# Patient Record
Sex: Male | Born: 1985 | Race: Black or African American | Hispanic: No | Marital: Single | Smoking: Current every day smoker
Health system: Southern US, Community
[De-identification: ages and names within clinical notes are randomized; demographics above are authoritative.]

## PROBLEM LIST (undated history)

## (undated) ENCOUNTER — Emergency Department (HOSPITAL_COMMUNITY): Admission: EM | Payer: 59

## (undated) DIAGNOSIS — S52209A Unspecified fracture of shaft of unspecified ulna, initial encounter for closed fracture: Secondary | ICD-10-CM

## (undated) DIAGNOSIS — K3532 Acute appendicitis with perforation and localized peritonitis, without abscess: Secondary | ICD-10-CM

## (undated) DIAGNOSIS — R339 Retention of urine, unspecified: Secondary | ICD-10-CM

## (undated) DIAGNOSIS — R011 Cardiac murmur, unspecified: Secondary | ICD-10-CM

## (undated) HISTORY — DX: Unspecified fracture of shaft of unspecified ulna, initial encounter for closed fracture: S52.209A

## (undated) HISTORY — PX: APPENDECTOMY: SHX54

## (undated) HISTORY — DX: Retention of urine, unspecified: R33.9

## (undated) HISTORY — DX: Acute appendicitis with perforation and localized peritonitis, without abscess: K35.32

---
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
## 2007-03-07 ENCOUNTER — Emergency Department (HOSPITAL_COMMUNITY): Admission: EM | Admit: 2007-03-07 | Discharge: 2007-03-07 | Payer: Self-pay | Admitting: Emergency Medicine

## 2007-07-17 ENCOUNTER — Ambulatory Visit (HOSPITAL_BASED_OUTPATIENT_CLINIC_OR_DEPARTMENT_OTHER): Admission: RE | Admit: 2007-07-17 | Discharge: 2007-07-17 | Payer: Self-pay | Admitting: Urology

## 2008-09-23 ENCOUNTER — Emergency Department (HOSPITAL_COMMUNITY): Admission: EM | Admit: 2008-09-23 | Discharge: 2008-09-23 | Payer: Self-pay | Admitting: Emergency Medicine

## 2010-08-27 ENCOUNTER — Emergency Department (HOSPITAL_COMMUNITY)
Admission: EM | Admit: 2010-08-27 | Discharge: 2010-08-27 | Payer: Self-pay | Source: Home / Self Care | Admitting: Family Medicine

## 2010-11-17 LAB — RAPID STREP SCREEN (MED CTR MEBANE ONLY): Streptococcus, Group A Screen (Direct): NEGATIVE

## 2010-12-15 NOTE — Op Note (Signed)
NAME:  [name], [name]                 ACCOUNT NO.:  [id number]   MEDICAL RECORD NO.:  [id number]          PATIENT TYPE:  AMB   LOCATION:  [facility]                         FACILITY:  [facility]   PHYSICIAN:  Valetta Fuller, M.D.  DATE OF BIRTH:  [birth year]   DATE OF PROCEDURE:  07/17/2007  DATE OF DISCHARGE:  07/17/2007                               OPERATIVE REPORT   PREOPERATIVE DIAGNOSIS:  Penile condyloma.   POSTOPERATIVE DIAGNOSIS:  Penile condyloma.   PROCEDURE PERFORMED:  SLT Yag laser of penile condyloma.   SURGEON:  Valetta Fuller, M.D.   ANESTHESIA:  General.   INDICATIONS:  Mr. Aldyn is a 25 year old male who had presented with  penile condyloma.  He was initially treated with topical therapy which  failed.  His disease was not particularly extensive, but did involve a  fairly broad area within the mucosal collar on the ventral aspect of his  penis.  For that reason, we did not think he would tolerate a cryo  procedure in the office and it would be difficult due to the spread-out  nature of the lesions.  We talked about several options and he did elect  to try SLT Yag laser.  He appeared to understand the complications as  well as the advantages and disadvantages of this approach.   TECHNIQUE AND FINDINGS:  The patient was brought to the operating room,  where he underwent successful induction of general anesthesia.  He  abdomen was soft placed in a supine position.  Moist/wet towels were  placed around the penis to reduce the risk of fire.  Aceto acid was used  for whitening.  Numerous lesions were noted on the ventral surface of  the penis within the mucosal collar.  The meatus was uninvolved.  All  the lesions were treated with a round-tip Contact SLT Yag laser  utilizing 15 watts of power.  All visible lesions were treated.  Bacitracin ointment was placed on the areas of treatment.  The patient  appeared to tolerate the procedure well.  There were no obvious  complications  or problems.           ______________________________  Valetta Fuller, M.D.  Electronically Signed     DSG/MEDQ  D:  07/18/2007  T:  07/19/2007  Job:  [id number]

## 2011-02-03 ENCOUNTER — Emergency Department (HOSPITAL_COMMUNITY)
Admission: EM | Admit: 2011-02-03 | Discharge: 2011-02-03 | Disposition: A | Discharge status: Home / Self Care | Payer: Self-pay | Attending: Emergency Medicine | Admitting: Emergency Medicine

## 2011-02-03 ENCOUNTER — Emergency Department (HOSPITAL_COMMUNITY): Payer: Self-pay

## 2011-02-03 DIAGNOSIS — R0789 Other chest pain: Secondary | ICD-10-CM | POA: Insufficient documentation

## 2011-02-03 DIAGNOSIS — R0989 Other specified symptoms and signs involving the circulatory and respiratory systems: Secondary | ICD-10-CM | POA: Insufficient documentation

## 2011-02-03 DIAGNOSIS — R0602 Shortness of breath: Secondary | ICD-10-CM | POA: Insufficient documentation

## 2011-02-03 DIAGNOSIS — R0609 Other forms of dyspnea: Secondary | ICD-10-CM | POA: Insufficient documentation

## 2011-04-05 ENCOUNTER — Emergency Department (HOSPITAL_COMMUNITY)
Admission: EM | Admit: 2011-04-05 | Discharge: 2011-04-05 | Disposition: A | Discharge status: Home / Self Care | Payer: Self-pay | Attending: Emergency Medicine | Admitting: Emergency Medicine

## 2011-04-05 DIAGNOSIS — S0990XA Unspecified injury of head, initial encounter: Secondary | ICD-10-CM | POA: Insufficient documentation

## 2011-04-05 DIAGNOSIS — S0180XA Unspecified open wound of other part of head, initial encounter: Secondary | ICD-10-CM | POA: Insufficient documentation

## 2011-04-05 DIAGNOSIS — IMO0002 Reserved for concepts with insufficient information to code with codable children: Secondary | ICD-10-CM | POA: Insufficient documentation

## 2011-04-13 ENCOUNTER — Emergency Department (HOSPITAL_COMMUNITY)
Admission: EM | Admit: 2011-04-13 | Discharge: 2011-04-13 | Disposition: A | Discharge status: Home / Self Care | Payer: No Typology Code available for payment source | Attending: Emergency Medicine | Admitting: Emergency Medicine

## 2011-04-13 DIAGNOSIS — Z4802 Encounter for removal of sutures: Secondary | ICD-10-CM | POA: Insufficient documentation

## 2011-05-07 LAB — POCT HEMOGLOBIN-HEMACUE
Hemoglobin: 14.9
Operator id: 134391

## 2011-05-18 ENCOUNTER — Emergency Department (HOSPITAL_COMMUNITY)
Admission: EM | Admit: 2011-05-18 | Discharge: 2011-05-18 | Disposition: A | Discharge status: Home / Self Care | Payer: Self-pay | Attending: Emergency Medicine | Admitting: Emergency Medicine

## 2011-05-18 ENCOUNTER — Emergency Department (HOSPITAL_COMMUNITY): Payer: Self-pay

## 2011-05-18 DIAGNOSIS — R10819 Abdominal tenderness, unspecified site: Secondary | ICD-10-CM | POA: Insufficient documentation

## 2011-05-18 DIAGNOSIS — R109 Unspecified abdominal pain: Secondary | ICD-10-CM | POA: Insufficient documentation

## 2011-05-18 LAB — COMPREHENSIVE METABOLIC PANEL
ALT: 123 U/L — ABNORMAL HIGH (ref 0–53)
Albumin: 3.9 g/dL (ref 3.5–5.2)
Alkaline Phosphatase: 170 U/L — ABNORMAL HIGH (ref 39–117)
Potassium: 3.5 mEq/L (ref 3.5–5.1)
Sodium: 140 mEq/L (ref 135–145)
Total Protein: 7.8 g/dL (ref 6.0–8.3)

## 2011-05-18 LAB — CBC
Hemoglobin: 14.3 g/dL (ref 13.0–17.0)
MCHC: 34.3 g/dL (ref 30.0–36.0)
RDW: 13.3 % (ref 11.5–15.5)
WBC: 9 10*3/uL (ref 4.0–10.5)

## 2011-05-18 LAB — URINALYSIS, ROUTINE W REFLEX MICROSCOPIC
Bilirubin Urine: NEGATIVE
Leukocytes, UA: NEGATIVE
Nitrite: NEGATIVE
Specific Gravity, Urine: 1.016 (ref 1.005–1.030)
Urobilinogen, UA: 1 mg/dL (ref 0.0–1.0)

## 2011-05-18 LAB — DIFFERENTIAL
Basophils Absolute: 0 10*3/uL (ref 0.0–0.1)
Basophils Relative: 0 % (ref 0–1)
Monocytes Absolute: 0.7 10*3/uL (ref 0.1–1.0)
Neutro Abs: 5.4 10*3/uL (ref 1.7–7.7)

## 2011-05-22 ENCOUNTER — Inpatient Hospital Stay (HOSPITAL_COMMUNITY)
Admission: EM | Admit: 2011-05-22 | Discharge: 2011-06-07 | DRG: 339 | Disposition: A | Discharge status: Home / Self Care | Payer: Self-pay | Attending: Surgery | Admitting: Surgery

## 2011-05-22 ENCOUNTER — Emergency Department (HOSPITAL_COMMUNITY): Payer: Self-pay

## 2011-05-22 DIAGNOSIS — K929 Disease of digestive system, unspecified: Secondary | ICD-10-CM | POA: Diagnosis not present

## 2011-05-22 DIAGNOSIS — Y836 Removal of other organ (partial) (total) as the cause of abnormal reaction of the patient, or of later complication, without mention of misadventure at the time of the procedure: Secondary | ICD-10-CM | POA: Diagnosis not present

## 2011-05-22 DIAGNOSIS — R1031 Right lower quadrant pain: Secondary | ICD-10-CM

## 2011-05-22 DIAGNOSIS — K560 Paralytic ileus: Secondary | ICD-10-CM | POA: Diagnosis not present

## 2011-05-22 DIAGNOSIS — K3532 Acute appendicitis with perforation and localized peritonitis, without abscess: Secondary | ICD-10-CM | POA: Diagnosis present

## 2011-05-22 DIAGNOSIS — R339 Retention of urine, unspecified: Secondary | ICD-10-CM | POA: Diagnosis not present

## 2011-05-22 DIAGNOSIS — K3533 Acute appendicitis with perforation and localized peritonitis, with abscess: Principal | ICD-10-CM | POA: Diagnosis present

## 2011-05-22 LAB — DIFFERENTIAL
Eosinophils Relative: 1 % (ref 0–5)
Lymphocytes Relative: 16 % (ref 12–46)
Lymphs Abs: 2 10*3/uL (ref 0.7–4.0)
Monocytes Absolute: 1.1 10*3/uL — ABNORMAL HIGH (ref 0.1–1.0)
Monocytes Relative: 9 % (ref 3–12)

## 2011-05-22 LAB — CBC
HCT: 42.2 % (ref 39.0–52.0)
MCHC: 34.4 g/dL (ref 30.0–36.0)
MCV: 87.7 fL (ref 78.0–100.0)
RDW: 12.9 % (ref 11.5–15.5)

## 2011-05-22 LAB — COMPREHENSIVE METABOLIC PANEL
Albumin: 3.8 g/dL (ref 3.5–5.2)
BUN: 9 mg/dL (ref 6–23)
Chloride: 96 mEq/L (ref 96–112)
Creatinine, Ser: 0.83 mg/dL (ref 0.50–1.35)
Total Bilirubin: 0.5 mg/dL (ref 0.3–1.2)
Total Protein: 8.1 g/dL (ref 6.0–8.3)

## 2011-05-22 LAB — LIPASE, BLOOD: Lipase: 12 U/L (ref 11–59)

## 2011-05-22 MED ORDER — IOHEXOL 300 MG/ML  SOLN: 100.0000 mL | Freq: Once | INTRAMUSCULAR | Status: AC | PRN

## 2011-05-23 ENCOUNTER — Inpatient Hospital Stay (HOSPITAL_COMMUNITY): Payer: Self-pay

## 2011-05-23 LAB — APTT: aPTT: 36 seconds (ref 24–37)

## 2011-05-23 LAB — CBC
HCT: 38.1 % — ABNORMAL LOW (ref 39.0–52.0)
RDW: 12.9 % (ref 11.5–15.5)
WBC: 11 10*3/uL — ABNORMAL HIGH (ref 4.0–10.5)

## 2011-05-23 LAB — BASIC METABOLIC PANEL
Calcium: 9.2 mg/dL (ref 8.4–10.5)
Chloride: 102 mEq/L (ref 96–112)
Creatinine, Ser: 0.81 mg/dL (ref 0.50–1.35)
GFR calc Af Amer: >90 mL/min (ref >90)
GFR calc non Af Amer: >90 mL/min (ref >90)

## 2011-05-23 LAB — PROTIME-INR
INR: 1.32 (ref 0.00–1.49)
Prothrombin Time: 16.6 seconds — ABNORMAL HIGH (ref 11.6–15.2)

## 2011-05-24 ENCOUNTER — Other Ambulatory Visit (INDEPENDENT_AMBULATORY_CARE_PROVIDER_SITE_OTHER): Payer: Self-pay | Admitting: General Surgery

## 2011-05-24 DIAGNOSIS — K352 Acute appendicitis with generalized peritonitis, without abscess: Secondary | ICD-10-CM

## 2011-05-24 LAB — CBC
HCT: 33.9 % — ABNORMAL LOW (ref 39.0–52.0)
Platelets: 298 10*3/uL (ref 150–400)
RDW: 13.1 % (ref 11.5–15.5)
WBC: 11.8 10*3/uL — ABNORMAL HIGH (ref 4.0–10.5)

## 2011-05-25 DIAGNOSIS — R339 Retention of urine, unspecified: Secondary | ICD-10-CM

## 2011-05-25 LAB — BASIC METABOLIC PANEL
CO2: 30 mEq/L (ref 19–32)
Glucose, Bld: 122 mg/dL — ABNORMAL HIGH (ref 70–99)
Potassium: 3.9 mEq/L (ref 3.5–5.1)
Sodium: 141 mEq/L (ref 135–145)

## 2011-05-25 LAB — CBC
Hemoglobin: 11.8 g/dL — ABNORMAL LOW (ref 13.0–17.0)
RBC: 4.02 MIL/uL — ABNORMAL LOW (ref 4.22–5.81)
WBC: 14.4 10*3/uL — ABNORMAL HIGH (ref 4.0–10.5)

## 2011-05-26 LAB — CBC
HCT: 35.2 % — ABNORMAL LOW (ref 39.0–52.0)
Hemoglobin: 11.4 g/dL — ABNORMAL LOW (ref 13.0–17.0)
MCH: 29.5 pg (ref 26.0–34.0)
MCHC: 32.4 g/dL (ref 30.0–36.0)
RBC: 3.87 MIL/uL — ABNORMAL LOW (ref 4.22–5.81)

## 2011-05-27 LAB — BODY FLUID CULTURE

## 2011-05-27 NOTE — Consult Note (Signed)
NAME:  [name], [name]                 ACCOUNT NO.:  [id number]  MEDICAL RECORD NO.:  [id number]  LOCATION:  [facility]                         FACILITY:  [facility]  PHYSICIAN:  Abigail Miyamoto, M.D. DATE OF BIRTH:  07-19-1986  DATE OF CONSULTATION:  05/22/2011 DATE OF DISCHARGE:                                CONSULTATION   CHIEF COMPLAINT:  Right-sided abdominal pain.  HISTORY:  This is a 25 year old male with a 2-week history of right lower quadrant abdominal pain.  He reports it came on suddenly approximately 2 weeks ago.  He had no nausea or vomiting with this.  He had difficulty walking and going up stairs secondary to the pain and some difficulty passing his urine secondary to discomfort.  He reports he has improved slightly and presented to the emergency room today, otherwise GI complaints, he has had no previous similar history of abdominal pain.  PAST MEDICAL HISTORY:  Negative.  PAST SURGICAL HISTORY:  Negative.  MEDICATIONS:  None.  ALLERGIES:  No known drug allergies.  FAMILY HISTORY:  Negative for diabetes, hypertension, or inflammatory bowel disease.  SOCIAL HISTORY:  He does admit to smoking.  Denies alcohol use.  REVIEW OF SYSTEMS:  GENERAL:  Negative for fever, chills.  PULMONARY: Negative for cough, shortness of breath, difficulty breathing.  CARDIAC: Negative for chest pain, irregular heartbeat.  ABDOMEN:  Listed as above.  Bowel movements have been normal.  URINARY:  Negative for dysuria or hematuria.  Rest of review of systems including skin, eyes, ears, nose and throat, musculoskeletal, neurologic, psychiatric, endocrine are normal.  PHYSICAL EXAMINATION:  GENERAL:  This is a very thin gentleman in no acute distress.  He is very comfortable in appearance. VITAL SIGNS:  Temperature of 100.6, pulse 83, respiratory rate is 18, blood pressure is 107/67. HEENT:  Eyes anicteric.  Pupils are reactive bilaterally.  ENT, external ears and nose are normal.  Hearing  is normal.  Oropharynx is clear. NECK:  Supple.  Trachea is midline.  There is no thyromegaly. LUNGS:  Clear to auscultation bilaterally with normal respiratory effort. CARDIOVASCULAR:  Regular rate and rhythm.  There is a systolic ejection murmur.  There is no peripheral edema. ABDOMEN:  Soft.  There is mild fullness in the right lower quadrant. There is only mild tenderness and almost no guarding.  The rest of the abdomen is soft and nontender. EXTREMITIES:  Warm, well perfused.  No edema, clubbing, or cyanosis. Peripheral pulses are intact to all 4 extremities. NEUROLOGIC:  Awake, alert, and oriented.  Motor and sensory function are gross intact to all 4 extremities. PSYCHIATRIC:  Normal behavior and affect.  DATA REVIEWED:  The patient has a white blood count elevated at 12.7 with a left shift.  Hemoglobin is 14.5, creatinine is 0.83, glucose is 94.  The patient has a CAT scan of the abdomen and pelvis that shows an abscess in the right lower quadrant which is approximately 5 cm in size. There is adenopathy around this.  It is difficult to the terminal ileum. The appendix is not visualized.  IMPRESSION:  The patient with intraabdominal abscess of uncertain etiology.  I do suspect ruptured appendicitis.  At this point,  I will discuss with the patient and his mother.  I recommended admission to the hospital for IV antibiotics and then Interventional Radiology will be consulted, place a CT-guided drainage catheter.  After I had long discussion with him and his family, he has refused admission to the hospital.  I explained the risk of signing out against medical advice including ongoing infection and even death.  Despite this, he does not wish to stay in the hospital.  He does state he will return tomorrow to the hospital which I have encouraged him to do.     Abigail Miyamoto, M.D.     DB/MEDQ  D:  05/22/2011  T:  05/22/2011  Job:  [id number]  Electronically Signed by Abigail Miyamoto M.D. on 05/27/2011 12:36:42 PM

## 2011-05-27 NOTE — Op Note (Signed)
[name], [name]                 ACCOUNT NO.:  [id number]  MEDICAL RECORD NO.:  [id number]  LOCATION:  5114                         FACILITY:  [facility]  PHYSICIAN:  Cherylynn Ridges, M.D.    DATE OF BIRTH:  1986-02-27  DATE OF PROCEDURE:  05/24/2011 DATE OF DISCHARGE:                              OPERATIVE REPORT   PREOPERATIVE DIAGNOSIS:  Ruptured appendicitis with abscess.  POSTOPERATIVE DIAGNOSIS:  Ruptured retrocecal appendicitis with abscess.  PROCEDURE:  Appendectomy with drainage of retrocecal abscess.  SURGEON:  Cherylynn Ridges, MD  ASSISTANT:  Brayton El, physician assistant.  ANESTHESIA:  General endotracheal.  ESTIMATED BLOOD LOSS:  200 mL.  COMPLICATIONS:  None.  CONDITION:  Stable.  FINDINGS:  Abscess cavity in the right lower quadrant in a retrocecal position with a pulverized appendix, however, the base appeared to be fine and we could ligate it without concern.  INDICATION FOR OPERATION:  The patient is a 25 year old male who was admitted 2 days ago with abdominal pain and CT showing an abscess. Percutaneous drainage was not possible.  He was started on IV antibiotics.  We reassessed the possibility of a percutaneous drain, however, because it did not seem as though this was forthcoming or possible and the abscess cavity and perforated appendix was not going to improve with IV antibiotics alone, he was taken to the operation.  DESCRIPTION OF OPERATION:  The patient was taken to the operating room and placed on table in a supine position.  After an adequate general endotracheal anesthetic was administered, he was prepped and draped in usual sterile manner exposing mostly the lower abdomen.  After proper time-out was performed identifying the patient and procedure to be performed, we made a lower midline incision using a #10 blade just to the right of the umbilicus.  We took it down to and into the peritoneal cavity with minimal difficulty.  We were able  to mobilize and identify a large inflammatory complex in the right lower quadrant. As we mobilized again, we entered into a purulent cavity and abscess cavity.  Aerobic and anaerobic cultures were sent.  Once that was done, we mobilized the right colon just up to the midportion of the ascending colon and the terminal ileum.  Upon reviewing the process, it seems as though the patient had a retrocecal appendix that ruptured into the retroperitoneum and also into the terminal ileum.  We were able to identify the base of the appendix at the cecum and we came across that with a TA 30 blue 3.5 mm stapler.  We came across the mesentery with minimal difficulty, did ligate 1 area with a 2-0 silk tie.  We had to dissect out what appeared to be the tip of the appendix underneath the terminal ileum in this very dense inflammatory rind.  As we did so, we were able to come across what appeared to be the tip of the appendix. We sent all that as one single specimen, although a lot of it appeared to be just pulverized and torn up tissue.  Once we had controlled the base and we had come across that, we irrigated with about 2 L of saline solution.  A 19-mm Blake drain was placed into the abscess cavity and just to the right of the distal colon.  We sutured it in place with 3-0 nylon.  We closed using looped 0- PDS suture.  We left the skin open, however, we put in some interrupted horizontal mattress sutures to be tied later once the wound was cleaned up and the patient's temperature has resolved.  All needle counts, sponge counts, and instrument counts were correct.     Cherylynn Ridges, M.D.     JOW/MEDQ  D:  05/24/2011  T:  05/24/2011  Job:  [id number]  Electronically Signed by Jimmye Norman M.D. on 05/27/2011 04:52:42 PM

## 2011-05-28 ENCOUNTER — Inpatient Hospital Stay (HOSPITAL_COMMUNITY): Payer: Self-pay

## 2011-05-28 LAB — ANAEROBIC CULTURE

## 2011-05-29 LAB — COMPREHENSIVE METABOLIC PANEL
ALT: 18 U/L (ref 0–53)
AST: 12 U/L (ref 0–37)
Albumin: 2.4 g/dL — ABNORMAL LOW (ref 3.5–5.2)
CO2: 30 mEq/L (ref 19–32)
Calcium: 9 mg/dL (ref 8.4–10.5)
Creatinine, Ser: 0.68 mg/dL (ref 0.50–1.35)
GFR calc non Af Amer: >90 mL/min (ref >90)
Sodium: 142 mEq/L (ref 135–145)
Total Protein: 6.4 g/dL (ref 6.0–8.3)

## 2011-05-29 LAB — CBC
MCH: 29.4 pg (ref 26.0–34.0)
MCV: 87.8 fL (ref 78.0–100.0)
Platelets: 471 10*3/uL — ABNORMAL HIGH (ref 150–400)
RBC: 3.94 MIL/uL — ABNORMAL LOW (ref 4.22–5.81)
RDW: 12.5 % (ref 11.5–15.5)

## 2011-05-30 ENCOUNTER — Inpatient Hospital Stay (HOSPITAL_COMMUNITY): Payer: Self-pay

## 2011-05-30 LAB — BASIC METABOLIC PANEL
BUN: 5 mg/dL — ABNORMAL LOW (ref 6–23)
CO2: 30 mEq/L (ref 19–32)
Chloride: 107 mEq/L (ref 96–112)
GFR calc non Af Amer: >90 mL/min (ref >90)
Glucose, Bld: 100 mg/dL — ABNORMAL HIGH (ref 70–99)
Potassium: 3.7 mEq/L (ref 3.5–5.1)
Sodium: 144 mEq/L (ref 135–145)

## 2011-05-30 LAB — CBC
HCT: 33.8 % — ABNORMAL LOW (ref 39.0–52.0)
Hemoglobin: 11.1 g/dL — ABNORMAL LOW (ref 13.0–17.0)
MCH: 29.4 pg (ref 26.0–34.0)
MCV: 89.7 fL (ref 78.0–100.0)
Platelets: 475 10*3/uL — ABNORMAL HIGH (ref 150–400)
RBC: 3.77 MIL/uL — ABNORMAL LOW (ref 4.22–5.81)
WBC: 6.9 10*3/uL (ref 4.0–10.5)

## 2011-05-31 LAB — BASIC METABOLIC PANEL
BUN: 4 mg/dL — ABNORMAL LOW (ref 6–23)
CO2: 28 mEq/L (ref 19–32)
Calcium: 9.4 mg/dL (ref 8.4–10.5)
Glucose, Bld: 106 mg/dL — ABNORMAL HIGH (ref 70–99)
Sodium: 138 mEq/L (ref 135–145)

## 2011-05-31 LAB — CBC
HCT: 34.4 % — ABNORMAL LOW (ref 39.0–52.0)
Hemoglobin: 11.3 g/dL — ABNORMAL LOW (ref 13.0–17.0)
MCH: 29 pg (ref 26.0–34.0)
MCHC: 32.8 g/dL (ref 30.0–36.0)
RBC: 3.9 MIL/uL — ABNORMAL LOW (ref 4.22–5.81)

## 2011-06-02 ENCOUNTER — Inpatient Hospital Stay (HOSPITAL_COMMUNITY): Payer: Self-pay

## 2011-06-02 LAB — DIFFERENTIAL
Eosinophils Absolute: 0.2 10*3/uL (ref 0.0–0.7)
Lymphs Abs: 1.3 10*3/uL (ref 0.7–4.0)
Monocytes Absolute: 0.4 10*3/uL (ref 0.1–1.0)
Monocytes Relative: 6 % (ref 3–12)
Neutro Abs: 3.8 10*3/uL (ref 1.7–7.7)
Neutrophils Relative %: 67 % (ref 43–77)

## 2011-06-02 LAB — CBC
Hemoglobin: 12.8 g/dL — ABNORMAL LOW (ref 13.0–17.0)
MCH: 28.9 pg (ref 26.0–34.0)
MCHC: 32.7 g/dL (ref 30.0–36.0)
MCV: 88.3 fL (ref 78.0–100.0)
RBC: 4.43 MIL/uL (ref 4.22–5.81)

## 2011-06-02 LAB — COMPREHENSIVE METABOLIC PANEL
BUN: 5 mg/dL — ABNORMAL LOW (ref 6–23)
CO2: 31 mEq/L (ref 19–32)
Calcium: 10.1 mg/dL (ref 8.4–10.5)
Chloride: 99 mEq/L (ref 96–112)
Creatinine, Ser: 0.8 mg/dL (ref 0.50–1.35)
GFR calc Af Amer: >90 mL/min (ref >90)
GFR calc non Af Amer: >90 mL/min (ref >90)
Glucose, Bld: 101 mg/dL — ABNORMAL HIGH (ref 70–99)
Total Bilirubin: 0.3 mg/dL (ref 0.3–1.2)

## 2011-06-02 LAB — MAGNESIUM: Magnesium: 2.1 mg/dL (ref 1.5–2.5)

## 2011-06-02 LAB — PHOSPHORUS: Phosphorus: 4 mg/dL (ref 2.3–4.6)

## 2011-06-02 MED ORDER — IOHEXOL 300 MG/ML  SOLN
100.0000 mL | Freq: Once | INTRAMUSCULAR | Status: AC | PRN
  Administered 2011-06-02: 100 mL via INTRAVENOUS

## 2011-06-03 LAB — COMPREHENSIVE METABOLIC PANEL
Alkaline Phosphatase: 147 U/L — ABNORMAL HIGH (ref 39–117)
BUN: 5 mg/dL — ABNORMAL LOW (ref 6–23)
Calcium: 10 mg/dL (ref 8.4–10.5)
Creatinine, Ser: 0.84 mg/dL (ref 0.50–1.35)
GFR calc Af Amer: >90 mL/min (ref >90)
Glucose, Bld: 103 mg/dL — ABNORMAL HIGH (ref 70–99)
Potassium: 4.1 mEq/L (ref 3.5–5.1)
Total Protein: 7.2 g/dL (ref 6.0–8.3)

## 2011-06-03 LAB — MAGNESIUM: Magnesium: 2 mg/dL (ref 1.5–2.5)

## 2011-06-03 LAB — PHOSPHORUS: Phosphorus: 3.7 mg/dL (ref 2.3–4.6)

## 2011-06-05 MED ORDER — KCL IN DEXTROSE-NACL 20-5-0.9 MEQ/L-%-% IV SOLN
INTRAVENOUS | Status: DC
  Administered 2011-06-06: 22:00:00 via INTRAVENOUS
  Filled 2011-06-05 (×4): qty 1000

## 2011-06-05 MED ORDER — FAMOTIDINE 20 MG PO TABS
20.0000 mg | ORAL_TABLET | Freq: Two times a day (BID) | ORAL | Status: DC
  Administered 2011-06-06 – 2011-06-07 (×3): 20 mg via ORAL
  Filled 2011-06-05 (×5): qty 1

## 2011-06-05 MED ORDER — PHENOL 1.4 % MT LIQD
1.0000 | OROMUCOSAL | Status: DC | PRN
  Filled 2011-06-05: qty 177

## 2011-06-05 MED ORDER — CHLORPROMAZINE HCL 25 MG/ML IJ SOLN
25.0000 mg | Freq: Four times a day (QID) | INTRAMUSCULAR | Status: DC | PRN
  Filled 2011-06-05: qty 1

## 2011-06-05 MED ORDER — CEPASTAT 14.5 MG MT LOZG
1.0000 | LOZENGE | OROMUCOSAL | Status: DC | PRN
  Filled 2011-06-05: qty 18

## 2011-06-05 MED ORDER — ACETAMINOPHEN 325 MG PO TABS: 650.0000 mg | ORAL_TABLET | Freq: Four times a day (QID) | ORAL | Status: DC | PRN

## 2011-06-05 MED ORDER — FAMOTIDINE IN NACL 20-0.9 MG/50ML-% IV SOLN
20.0000 mg | Freq: Two times a day (BID) | INTRAVENOUS | Status: DC
  Filled 2011-06-05 (×3): qty 50

## 2011-06-05 MED ORDER — BIOTENE DRY MOUTH MT LIQD
15.0000 mL | Freq: Two times a day (BID) | OROMUCOSAL | Status: DC
  Administered 2011-06-06 (×2): 15 mL via OROMUCOSAL

## 2011-06-05 MED ORDER — TAMSULOSIN HCL 0.4 MG PO CAPS
0.4000 mg | ORAL_CAPSULE | Freq: Every day | ORAL | Status: DC
  Administered 2011-06-06 – 2011-06-07 (×2): 0.4 mg via ORAL
  Filled 2011-06-05 (×3): qty 1

## 2011-06-05 MED ORDER — ONDANSETRON HCL 4 MG/2ML IJ SOLN
4.0000 mg | Freq: Four times a day (QID) | INTRAMUSCULAR | Status: DC
  Administered 2011-06-06 – 2011-06-07 (×6): 4 mg via INTRAVENOUS
  Filled 2011-06-05 (×6): qty 2

## 2011-06-05 MED ORDER — SODIUM CHLORIDE 0.9 % IV SOLN
1.0000 g | INTRAVENOUS | Status: DC
  Administered 2011-06-06: 1 g via INTRAVENOUS
  Filled 2011-06-05 (×2): qty 1

## 2011-06-05 MED ORDER — PROMETHAZINE HCL 25 MG/ML IJ SOLN: 12.5000 mg | Freq: Four times a day (QID) | INTRAMUSCULAR | Status: DC | PRN

## 2011-06-05 MED ORDER — CHLORHEXIDINE GLUCONATE 0.12 % MT SOLN
15.0000 mL | Freq: Two times a day (BID) | OROMUCOSAL | Status: DC
  Administered 2011-06-06: 15 mL via OROMUCOSAL

## 2011-06-05 MED ORDER — ENOXAPARIN SODIUM 40 MG/0.4ML ~~LOC~~ SOLN
40.0000 mg | SUBCUTANEOUS | Status: DC
  Administered 2011-06-06: 40 mg via SUBCUTANEOUS
  Filled 2011-06-05 (×2): qty 0.4

## 2011-06-06 DIAGNOSIS — K3532 Acute appendicitis with perforation and localized peritonitis, without abscess: Secondary | ICD-10-CM | POA: Diagnosis present

## 2011-06-06 LAB — CBC
HCT: 36.9 % — ABNORMAL LOW (ref 39.0–52.0)
Hemoglobin: 12.3 g/dL — ABNORMAL LOW (ref 13.0–17.0)
MCH: 28.9 pg (ref 26.0–34.0)
MCV: 86.6 fL (ref 78.0–100.0)
RBC: 4.26 MIL/uL (ref 4.22–5.81)
WBC: 4.6 10*3/uL (ref 4.0–10.5)

## 2011-06-06 LAB — COMPREHENSIVE METABOLIC PANEL
ALT: 72 U/L — ABNORMAL HIGH (ref 0–53)
Albumin: 3.1 g/dL — ABNORMAL LOW (ref 3.5–5.2)
Alkaline Phosphatase: 149 U/L — ABNORMAL HIGH (ref 39–117)
Total Protein: 7.3 g/dL (ref 6.0–8.3)

## 2011-06-06 MED ORDER — HYDROCODONE-ACETAMINOPHEN 5-325 MG PO TABS
1.0000 | ORAL_TABLET | ORAL | Status: DC | PRN
  Administered 2011-06-06: 1 via ORAL
  Administered 2011-06-07: 2 via ORAL
  Filled 2011-06-06: qty 1
  Filled 2011-06-06: qty 2

## 2011-06-06 MED ORDER — SODIUM CHLORIDE 0.9 % IJ SOLN: 3.0000 mL | INTRAMUSCULAR | Status: DC | PRN

## 2011-06-06 NOTE — Progress Notes (Signed)
  Subjective: Patient feels much better.  Several bowel movements  Objective: Vital signs in last 24 hours: Temp:  [97.2 F (36.2 C)] 97.2 F (36.2 C) (11/04 0606) Pulse Rate:  [58] 58  (11/04 0606) Resp:  [16] 16  (11/04 0606) BP: (101)/(56) 101/56 mmHg (11/04 0606) SpO2:  [100 %] 100 % (11/04 0606)    Intake/Output from previous day: 11/03 0701 - 11/04 0700 In: 140 [I.V.:140] Out: 755 [Urine:750; Drains:5] Intake/Output this shift:    GI: soft, non-tender; bowel sounds normal; no masses,  no organomegaly  Lab Results:   Basename 06/06/11 0700  WBC 4.6  HGB 12.3*  HCT 36.9*  PLT 523*   BMET  Basename 06/06/11 0700  NA 138  K 3.9  CL 102  CO2 25  GLUCOSE 109*  BUN 6  CREATININE 0.79  CALCIUM 10.0   PT/INR No results found for this basename: LABPROT:2,INR:2 in the last 72 hours ABG No results found for this basename: PHART:2,PCO2:2,PO2:2,HCO3:2 in the last 72 hours  Studies/Results: No results found.  Anti-infectives: Anti-infectives    None      Assessment/Plan: s/p exp lap for perforated appendicitis Advance diet Plan for discharge tomorrow  LOS: 15 days    Carliyah Cotterman K. 06/06/2011

## 2011-06-07 DIAGNOSIS — K3533 Acute appendicitis with perforation and localized peritonitis, with abscess: Secondary | ICD-10-CM | POA: Diagnosis present

## 2011-06-07 DIAGNOSIS — K3532 Acute appendicitis with perforation and localized peritonitis, without abscess: Secondary | ICD-10-CM

## 2011-06-07 DIAGNOSIS — R339 Retention of urine, unspecified: Secondary | ICD-10-CM

## 2011-06-07 HISTORY — DX: Retention of urine, unspecified: R33.9

## 2011-06-07 HISTORY — DX: Acute appendicitis with perforation, localized peritonitis, and gangrene, without abscess: K35.32

## 2011-06-07 MED ORDER — ACETAMINOPHEN 325 MG PO TABS: 650.0000 mg | ORAL_TABLET | Freq: Four times a day (QID) | ORAL | Status: AC | PRN

## 2011-06-07 MED ORDER — TAMSULOSIN HCL 0.4 MG PO CAPS: 0.4000 mg | ORAL_CAPSULE | Freq: Every day | ORAL | Status: DC

## 2011-06-07 MED ORDER — HYDROCODONE-ACETAMINOPHEN 5-325 MG PO TABS: 1.0000 | ORAL_TABLET | ORAL | Status: AC | PRN

## 2011-06-07 MED ORDER — AMOXICILLIN-POT CLAVULANATE 875-125 MG PO TABS: 1.0000 | ORAL_TABLET | Freq: Two times a day (BID) | ORAL | Status: AC

## 2011-06-07 NOTE — Discharge Summary (Signed)
Physician Discharge Summary  Patient ID: [name] MRN: [id number] DOB/AGE: 25-Aug-1985 25 y.o.  Admit date: 05/22/2011 Discharge date: 06/07/2011  Admission Diagnoses: Ruptured appendicitis with abscess   Discharge Diagnoses:  Principal Problem:  *Perforated appendicitis Active Problems:  Appendicitis with perforation  Appendicitis with abscess  Urinary retention with incomplete bladder emptying   PROCEDURES: Appendectomy with drainage of retrocecal abscess and in 23/22/2012 Dr. Jimmye Norman.     Hospital Course: The patient is a 25 year old gentleman who presented on 05/22/2011 with abdominal pain and a CT showing an abscess. He was admitted started on IV antibiotics and was sent to an interventional radiology 05/23/2011. Interventional radiology did not feel it was safe to approach this percutaneously. He was then returned to the floor continued on antibiotics his temperature went up to 103.2 the evening of early a.m. 05/24/2011. He was seen the following a.m. by Dr. Jimmye Norman, and at that point the decision was made to do an exploratory laparotomy and drainage of the abscess. Patient underwent successful exploratory laparotomy and drainage with a Blake drain being placed in the abscess site. He returned to the floor and required a postoperative Foley for urinary retention. This was left in for the next 48 hours. He developed a postoperative ileus. His abdomen became more distended on the third postoperative day. Fourth postoperative day he had increased abdominal distention and was unable to void. A Foley was reinserted. He required an NG tube on his fifth postoperative day. This helped resolve his abdominal distention. He was maintained on IV antibiotics throughout this period he refused TNA. Repeat CT on 06/02/2011 shows several small abscesses noted a big enough to drain his abdominal distention improved his NG was clamped. He slowly improved and his diet was advanced. His Foley was  discontinued on 06/05/2011. Body a.m. of 06/07/2011 he was ambulating without difficulty voiding without difficulty. His incisions were healing nicely. He had minimal drainage through his [place] drain. This was clear and serous.  It was Dr. Michaell Cowing his opinion that we could discontinue his sutures DC his drain. He could then be discharged to home. We plan to keep him on 10 more days of antibiotics. We'll also keep him on 14 more days of Flomax for his urinary retention. He will followup with Dr. Lindie Spruce in 2 weeks. We will benzoin and Steri-Strip his incisions. He may shower. He is to call for any problems with fever difficulty voiding increased abdominal pain or drainage from his incisions. A postoperative abdominal surgery instruction sheet will be given to the patient also.  Condition on discharge: Improved. This is Will Proofreader physician assistant for Dr. Karie Soda.          Disposition: Home or Self Care   Current Discharge Medication List    START taking these medications   Details  acetaminophen (TYLENOL) 325 MG tablet Take 2 tablets (650 mg total) by mouth every 6 (six) hours as needed for pain (for temp > 101). Qty: 1 tablet, Refills: 0    amoxicillin-clavulanate (AUGMENTIN) 875-125 MG per tablet Take 1 tablet by mouth 2 (two) times daily. Qty: 20 tablet, Refills: 0    HYDROcodone-acetaminophen (NORCO) 5-325 MG per tablet Take 1-2 tablets by mouth every 4 (four) hours as needed. Qty: 40 tablet, Refills: 0    Tamsulosin HCl (FLOMAX) 0.4 MG CAPS Take 1 capsule (0.4 mg total) by mouth daily. Qty: 14 capsule, Refills: 0         Signed: Roselyne Stalnaker 06/07/2011, 10:02 AM

## 2011-06-07 NOTE — Progress Notes (Signed)
Mother of patient called to state that she couldn't afford the discharge medications for the patient, Flomax and Augmentin.  She stated they were $51 and $75 respectively. Took her number and called the PA to get further info. With the PA on one line, I called the pt's mother back.  She went on to say that she had actually picked up the medications and paid for them but was wanting assistance with reimbursement.  Advised her that we could not help her with that. Mom asked if she could go to [employer] Social [employer] to apply for Medicaid to get reimbursement.  I explained that she could do this but likely would not be successful.  She stated understanding. Reinforced info that the Flomax was prn and that he had to take the entire Rx as directed. She stated understanding.

## 2011-06-07 NOTE — Progress Notes (Signed)
Discharge instructions/Med List reviewed w/ pt. Pt expressed understanding and copies given w/ prescriptions. Pt ambulated off unit in stable condition w/ his mom per his request.

## 2011-06-07 NOTE — Progress Notes (Addendum)
Subjective: Feels much better.  Eating regular diet, Voiding without difficulty.  Objective: Vital signs in last 24 hours: Temp:  [98.2 F (36.8 C)-98.3 F (36.8 C)] 98.3 F (36.8 C) (11/05 0600) Pulse Rate:  [65-78] 65  (11/05 0600) Resp:  [16-19] 19  (11/05 0600) BP: (105-107)/(60-76) 106/60 mmHg (11/05 0600) SpO2:  [98 %-100 %] 98 % (11/05 0600) Last BM Date: 06/05/11  Intake/Output from previous day: 11/04 0701 - 11/05 0700 In: 659 [P.O.:120; I.V.:489; IV Piggyback:50] Out: 1040 [Urine:1000; Drains:40] Intake/Output this shift:    General appearance: alert, cooperative and no distress Resp: clear to auscultation bilaterally GI: Soft, non tender, wounds are looking good, taking regular diet.  Drainage from [facility] is clear. 40ml recoreded for yesterday Incision/Wound: Looks quite good  Lab Results:  @LABLAST2 (wbc:2,hgb:2,hct:2,plt:2) none BMET  Basename 06/06/11 0700  NA 138  K 3.9  CL 102  CO2 25  GLUCOSE 109*  BUN 6  CREATININE 0.79  CALCIUM 10.0   PT/INR No results found for this basename: LABPROT:2,INR:2 in the last 72 hours   Studies/Results: No results found.  Anti-infectives: Anti-infectives    None     Current Facility-Administered Medications  Medication Dose Route Frequency Provider Last Rate Last Dose  . acetaminophen (TYLENOL) tablet 650 mg  650 mg Oral Q6H PRN Thomas A. Cornett, MD      . chlorproMAZINE (THORAZINE) 25 mg in sodium chloride 0.9 % 25 mL IVPB  25 mg Intravenous Q6H PRN Thomas A. Cornett, MD      . chlorproMAZINE (THORAZINE) injection 25 mg  25 mg Intramuscular Q6H PRN Thomas A. Cornett, MD      . dextrose 5 % and 0.9 % NaCl with KCl 20 mEq/L infusion   Intravenous Continuous Thomas A. Cornett, MD 30 mL/hr at 06/07/11 0800    . enoxaparin (LOVENOX) injection 40 mg  40 mg Subcutaneous Q24H Thomas A. Cornett, MD   40 mg at 06/06/11 1303  . ertapenem (INVANZ) 1 g in sodium chloride 0.9 % 50 mL IVPB  1 g Intravenous Q24H Thomas  A. Cornett, MD   1 g at 06/06/11 2146  . famotidine (PEPCID) tablet 20 mg  20 mg Oral BID Dannielle Karvonen [place], PHARMD   20 mg at 06/06/11 2149  . HYDROcodone-acetaminophen (NORCO) 5-325 MG per tablet 1-2 tablet  1-2 tablet Oral Q4H PRN Wilmon Arms. Tsuei, MD   2 tablet at 06/07/11 [phone]  . ondansetron (ZOFRAN) injection 4 mg  4 mg Intravenous Q6H Thomas A. Cornett, MD   4 mg at 06/07/11 0621  . phenol (CHLORASEPTIC) mouth spray 1 spray  1 spray Mouth/Throat PRN Thomas A. Cornett, MD      . phenol-menthol (CEPASTAT) lozenge 1 lozenge  1 lozenge Buccal PRN Thomas A. Cornett, MD      . promethazine (PHENERGAN) injection 12.5 mg  12.5 mg Intravenous Q6H PRN Thomas A. Cornett, MD      . sodium chloride 0.9 % injection 3 mL  3 mL Intravenous PRN Wilmon Arms. Tsuei, MD      . Tamsulosin HCl (FLOMAX) capsule 0.4 mg  0.4 mg Oral Daily Thomas A. Cornett, MD   0.4 mg at 06/06/11 1049  . DISCONTD: antiseptic oral rinse (BIOTENE) solution 15 mL  15 mL Mouth Rinse q12n4p Thomas A. Cornett, MD   15 mL at 06/06/11 1600  . DISCONTD: chlorhexidine (PERIDEX) 0.12 % solution 15 mL  15 mL Mouth/Throat BID Thomas A. Cornett, MD   15 mL at 06/06/11  1302    Assessment/Plan  Patient Active Problem List  Diagnoses  . Perforated appendicitis  . Appendicitis with perforation  . Appendicitis with abscess  . Urinary retention with incomplete bladder emptying  Plan: Drain removal, and home on antibiotics.  LOS: 16 days    Giada Schoppe 06/07/2011

## 2011-06-07 NOTE — Discharge Summary (Signed)
Pt seen.  I agree

## 2011-06-22 ENCOUNTER — Ambulatory Visit (INDEPENDENT_AMBULATORY_CARE_PROVIDER_SITE_OTHER): Payer: Self-pay | Admitting: General Surgery

## 2011-06-22 ENCOUNTER — Encounter (INDEPENDENT_AMBULATORY_CARE_PROVIDER_SITE_OTHER): Payer: Self-pay | Admitting: General Surgery

## 2011-06-22 VITALS — BP 104/70 | HR 60 | Temp 99.2°F | Resp 14 | Ht 71.0 in | Wt 119.8 lb

## 2011-06-22 DIAGNOSIS — Z09 Encounter for follow-up examination after completed treatment for conditions other than malignant neoplasm: Secondary | ICD-10-CM | POA: Insufficient documentation

## 2011-06-22 NOTE — Patient Instructions (Signed)
No heavy lifting greater than 25 pounds for the next 2 weeks. At that point she can resume normal activity.

## 2011-06-22 NOTE — Progress Notes (Signed)
HPI The patient is status post open appendectomy through a lower midline incision for a major of the ruptured appendicitis .  PE Doing well with some mild widening of his lower midline incision but no evidence of keloid formation. He was nontender without palpable masses.  Studiy review None  Assessment Doing well status post open appendectomy through a lower midline incision for a bad ruptured appendicitis.  Plan Return to see me on a p.r.n. basis. He can return to work in 2 weeks

## 2012-05-19 ENCOUNTER — Emergency Department (HOSPITAL_COMMUNITY)
Admission: EM | Admit: 2012-05-19 | Discharge: 2012-05-19 | Disposition: A | Discharge status: Home / Self Care | Payer: Self-pay | Attending: Emergency Medicine | Admitting: Emergency Medicine

## 2012-05-19 ENCOUNTER — Encounter (HOSPITAL_COMMUNITY): Payer: Self-pay | Admitting: *Deleted

## 2012-05-19 DIAGNOSIS — T63461A Toxic effect of venom of wasps, accidental (unintentional), initial encounter: Secondary | ICD-10-CM | POA: Insufficient documentation

## 2012-05-19 DIAGNOSIS — T63441A Toxic effect of venom of bees, accidental (unintentional), initial encounter: Secondary | ICD-10-CM

## 2012-05-19 DIAGNOSIS — T6391XA Toxic effect of contact with unspecified venomous animal, accidental (unintentional), initial encounter: Secondary | ICD-10-CM | POA: Insufficient documentation

## 2012-05-19 MED ORDER — IBUPROFEN 200 MG PO TABS
400.0000 mg | ORAL_TABLET | Freq: Once | ORAL | Status: AC
  Administered 2012-05-19: 400 mg via ORAL
  Filled 2012-05-19: qty 2

## 2012-05-19 MED ORDER — DIPHENHYDRAMINE HCL 25 MG PO TABS: 25.0000 mg | ORAL_TABLET | Freq: Four times a day (QID) | ORAL | Status: DC

## 2012-05-19 MED ORDER — FAMOTIDINE 20 MG PO TABS: 20.0000 mg | ORAL_TABLET | Freq: Two times a day (BID) | ORAL | Status: DC

## 2012-05-19 MED ORDER — OXYCODONE-ACETAMINOPHEN 5-325 MG PO TABS
1.0000 | ORAL_TABLET | Freq: Once | ORAL | Status: AC
  Administered 2012-05-19: 1 via ORAL
  Filled 2012-05-19: qty 1

## 2012-05-19 MED ORDER — DEXAMETHASONE SODIUM PHOSPHATE 10 MG/ML IJ SOLN
10.0000 mg | Freq: Once | INTRAMUSCULAR | Status: AC
  Administered 2012-05-19: 10 mg via INTRAMUSCULAR
  Filled 2012-05-19: qty 1

## 2012-05-19 MED ORDER — DIPHENHYDRAMINE HCL 25 MG PO CAPS
25.0000 mg | ORAL_CAPSULE | Freq: Once | ORAL | Status: AC
  Administered 2012-05-19: 25 mg via ORAL
  Filled 2012-05-19: qty 1

## 2012-05-19 NOTE — ED Provider Notes (Signed)
Medical screening examination/treatment/procedure(s) were performed by non-physician practitioner and as supervising physician I was immediately available for consultation/collaboration.  Brandt Loosen, MD 05/19/12 0800

## 2012-05-19 NOTE — ED Provider Notes (Signed)
Medical screening examination/treatment/procedure(s) were conducted as a shared visit with non-physician practitioner(s) and myself.  I personally evaluated the patient during the encounter  Brandt Loosen, MD 05/19/12 [phone]

## 2012-05-19 NOTE — ED Notes (Signed)
Pt to RN first; pt got stung by bee and has swollen upper lip, denies throat swelling or difficulty breathing

## 2012-05-19 NOTE — ED Provider Notes (Signed)
History     CSN: [id number]  Arrival date & time 05/19/12  0125   First MD Initiated Contact with Patient 05/19/12 0142      Chief Complaint  Patient presents with  . Insect Bite   HPI  History provided by the patient. Patient is a 26 year old male with no significant PMH who presents with swollen upper lip from a bee sting earlier today. Patient states he was outside and stung by a bee around noon. he had immediate swelling of his upper lip. There was some pain also. He tried using ice over the area for the swelling is rested most the day but has no significant change. He denies any swelling of the tongue, throat or difficulty breathing. Denies any rash on the skin. He has no previous history of anaphylactic reaction to bee stings. He denies any other complaints.    History reviewed. No pertinent past medical history.  Past Surgical History  Procedure Date  . Appendectomy     History reviewed. No pertinent family history.  History  Substance Use Topics  . Smoking status: Current Every Day Smoker  . Smokeless tobacco: Not on file  . Alcohol Use: Yes      Review of Systems  Constitutional: Negative for fever and chills.  HENT: Negative for sore throat, trouble swallowing and voice change.   Respiratory: Negative for cough, shortness of breath and stridor.   Cardiovascular: Negative for chest pain.    Allergies  Review of patient's allergies indicates no known allergies.  Home Medications  No current outpatient prescriptions on file.  BP 107/67  Pulse 57  Temp 98.2 F (36.8 C) (Oral)  SpO2 99%  Physical Exam  Nursing note and vitals reviewed. Constitutional: He is oriented to person, place, and time. He appears well-developed and well-nourished.  HENT:  Head: Normocephalic.  Mouth/Throat: Oropharynx is clear and moist.       Diffuse swelling of the upper lip. No swelling of the tongue or pharynx.  Neck: Normal range of motion. Neck supple.    Cardiovascular: Normal rate and regular rhythm.   Pulmonary/Chest: Effort normal and breath sounds normal. No stridor. No respiratory distress. He has no wheezes. He has no rales.  Neurological: He is alert and oriented to person, place, and time.  Skin: Skin is warm and dry. No rash noted. No erythema.  Psychiatric: He has a normal mood and affect.    ED Course  Procedures      1. Bee sting reaction       MDM  2:50 AM patient seen and evaluated. Patient in no acute distress. No signs of severe reaction from sting. Symptoms have been present for the past 12 hours. Plan to treat with one dose of steroid and antihistamines at home.        Angus Seller, [place] 05/19/12 [phone]

## 2012-05-19 NOTE — ED Notes (Signed)
Pt reports was stung by a bee thursday around 1200 noon, stung in upper lip, lip swelling noted/significant, (denies: sob, itching or rash), c/o pain and upper lip swelling noted.  alert, NAD, calm, interactive, skin W&D, resps e/u, speaking in clear complete sentences. No meds PTA.

## 2012-07-30 ENCOUNTER — Emergency Department (HOSPITAL_COMMUNITY)
Admission: EM | Admit: 2012-07-30 | Discharge: 2012-07-30 | Disposition: A | Discharge status: Home / Self Care | Payer: Self-pay | Attending: Emergency Medicine | Admitting: Emergency Medicine

## 2012-07-30 ENCOUNTER — Emergency Department (HOSPITAL_COMMUNITY): Payer: Self-pay

## 2012-07-30 ENCOUNTER — Encounter (HOSPITAL_COMMUNITY): Payer: Self-pay

## 2012-07-30 DIAGNOSIS — R109 Unspecified abdominal pain: Secondary | ICD-10-CM

## 2012-07-30 DIAGNOSIS — F17200 Nicotine dependence, unspecified, uncomplicated: Secondary | ICD-10-CM | POA: Insufficient documentation

## 2012-07-30 DIAGNOSIS — R1032 Left lower quadrant pain: Secondary | ICD-10-CM | POA: Insufficient documentation

## 2012-07-30 LAB — COMPREHENSIVE METABOLIC PANEL
ALT: 63 U/L — ABNORMAL HIGH (ref 0–53)
AST: 46 U/L — ABNORMAL HIGH (ref 0–37)
Calcium: 9.4 mg/dL (ref 8.4–10.5)
GFR calc Af Amer: >90 mL/min (ref >90)
Glucose, Bld: 84 mg/dL (ref 70–99)
Sodium: 142 mEq/L (ref 135–145)
Total Protein: 7.5 g/dL (ref 6.0–8.3)

## 2012-07-30 LAB — CBC WITH DIFFERENTIAL/PLATELET
Basophils Absolute: 0 10*3/uL (ref 0.0–0.1)
Basophils Relative: 0 % (ref 0–1)
Eosinophils Absolute: 0.2 10*3/uL (ref 0.0–0.7)
Eosinophils Relative: 4 % (ref 0–5)
MCH: 28.2 pg (ref 26.0–34.0)
MCHC: 32.6 g/dL (ref 30.0–36.0)
MCV: 86.5 fL (ref 78.0–100.0)
Platelets: 209 10*3/uL (ref 150–400)
RDW: 14.4 % (ref 11.5–15.5)

## 2012-07-30 LAB — URINALYSIS, MICROSCOPIC ONLY
Hgb urine dipstick: NEGATIVE
Nitrite: NEGATIVE
Specific Gravity, Urine: 1.026 (ref 1.005–1.030)
Urobilinogen, UA: 1 mg/dL (ref 0.0–1.0)

## 2012-07-30 NOTE — ED Notes (Addendum)
Pt reports generalized abd pain and increase gas x2 weeks. Pt denies N/V/D

## 2012-07-30 NOTE — ED Provider Notes (Signed)
History     CSN: [id number]  Arrival date & time 07/30/12  1011   First MD Initiated Contact with Patient 07/30/12 1204      Chief Complaint  Patient presents with  . Abdominal Pain    (Consider location/radiation/quality/duration/timing/severity/associated sxs/prior treatment) Patient is a 26 y.o. male presenting with abdominal pain. The history is provided by the patient.  Abdominal Pain The primary symptoms of the illness include abdominal pain. The primary symptoms of the illness do not include fever, nausea, vomiting or dysuria.  Symptoms associated with the illness do not include chills. Associated symptoms comments: Off and on abdominal pain that changes locations before resolving. No N, V, D or fever. He is currently pain free. History includes open surgery for appendectomy last year. He denies back pain. No urinary symptoms or flank pain.[name]    History reviewed. No pertinent past medical history.  Past Surgical History  Procedure Date  . Appendectomy     History reviewed. No pertinent family history.  History  Substance Use Topics  . Smoking status: Current Every Day Smoker  . Smokeless tobacco: Not on file  . Alcohol Use: Yes      Review of Systems  Constitutional: Negative for fever and chills.  Cardiovascular: Negative.   Gastrointestinal: Positive for abdominal pain. Negative for nausea and vomiting.  Genitourinary: Negative for dysuria and flank pain.  Musculoskeletal: Negative.   Neurological: Negative.     Allergies  Review of patient's allergies indicates no known allergies.  Home Medications  No current outpatient prescriptions on file.  BP 106/60  Pulse 51  Temp 97.9 F (36.6 C) (Oral)  Resp 15  SpO2 99%  Physical Exam  Constitutional: He appears well-developed and well-nourished.  HENT:  Head: Normocephalic.  Neck: Normal range of motion. Neck supple.  Cardiovascular: Normal rate and regular rhythm.   Pulmonary/Chest: Effort normal  and breath sounds normal.  Abdominal: Soft. Bowel sounds are normal. There is no tenderness. There is no rebound and no guarding.       Abdomen completely nontender. Midline incisional scar in lower abdomen unremarkable and nontender.   Musculoskeletal: Normal range of motion.  Neurological: He is alert. No cranial nerve deficit.  Skin: Skin is warm and dry. No rash noted.  Psychiatric: He has a normal mood and affect.    ED Course  Procedures (including critical care time)  Labs Reviewed  CBC WITH DIFFERENTIAL - Abnormal; Notable for the following:    Neutrophils Relative 41 (*)     Lymphocytes Relative 48 (*)     All other components within normal limits  COMPREHENSIVE METABOLIC PANEL - Abnormal; Notable for the following:    AST 46 (*)     ALT 63 (*)     Alkaline Phosphatase 118 (*)     All other components within normal limits  LIPASE, BLOOD  URINALYSIS, MICROSCOPIC ONLY   Dg Abd Acute W/chest  07/30/2012  *RADIOLOGY REPORT*  Clinical Data: Abdominal cramping  ACUTE ABDOMEN SERIES (ABDOMEN 2 VIEW & CHEST 1 VIEW)  Comparison: 05/30/2011  Findings: The lungs are clear without focal consolidation, edema, effusion or pneumothorax.  Cardiopericardial silhouette is within normal limits for size.  Imaged bony structures of the thorax are intact.  Upright film shows no evidence for intraperitoneal free air. Supine film shows no overt gaseous bowel dilatation to suggest obstruction.  There are some mildly gas distended jejunal loops in the left upper quadrant.  Air is seen scattered along the course of a  nondilated colon.  IMPRESSION: No acute cardiopulmonary process.  No overt bowel obstruction or evidence for bowel perforation. There are some mildly gas distended jejunal loops in the left upper quadrant which are equivocal.  A degree of underlying focal ileus is a possibility.   Original Report Authenticated By: Kennith Center, M.D.      No diagnosis found.    MDM  Essentially normal  lab studies, x-ray. He is nontender on re-evaluation by Dr. Denton Lank. Stable for discharge.         Rodena Medin, PA-C 07/30/12 1512

## 2012-07-30 NOTE — ED Notes (Signed)
C/o intermittent LLQ pain with an increase in passing gas & "stomach growling a lot". Denies n/v/d, fever, chills, urinary frequency, dysuria. Does report decrease in appetite.

## 2012-08-01 NOTE — ED Provider Notes (Signed)
Medical screening examination/treatment/procedure(s) were conducted as a shared visit with non-physician practitioner(s) and myself.  I personally evaluated the patient during the encounter Pt c/o abd earlier, currently resolved. abd soft nt.   Suzi Roots, MD 08/01/12 [phone]

## 2013-07-12 ENCOUNTER — Emergency Department (HOSPITAL_COMMUNITY)
Admission: EM | Admit: 2013-07-12 | Discharge: 2013-07-12 | Disposition: A | Discharge status: Home / Self Care | Payer: Self-pay | Attending: Emergency Medicine | Admitting: Emergency Medicine

## 2013-07-12 ENCOUNTER — Emergency Department (HOSPITAL_COMMUNITY): Payer: Self-pay

## 2013-07-12 ENCOUNTER — Encounter (HOSPITAL_COMMUNITY): Payer: Self-pay | Admitting: Emergency Medicine

## 2013-07-12 DIAGNOSIS — R591 Generalized enlarged lymph nodes: Secondary | ICD-10-CM

## 2013-07-12 DIAGNOSIS — R599 Enlarged lymph nodes, unspecified: Secondary | ICD-10-CM | POA: Insufficient documentation

## 2013-07-12 DIAGNOSIS — F17200 Nicotine dependence, unspecified, uncomplicated: Secondary | ICD-10-CM | POA: Insufficient documentation

## 2013-07-12 DIAGNOSIS — Z792 Long term (current) use of antibiotics: Secondary | ICD-10-CM | POA: Insufficient documentation

## 2013-07-12 LAB — POCT I-STAT, CHEM 8
BUN: 16 mg/dL (ref 6–23)
Chloride: 108 mEq/L (ref 96–112)
Potassium: 3.4 mEq/L — ABNORMAL LOW (ref 3.5–5.1)
Sodium: 135 mEq/L (ref 135–145)

## 2013-07-12 LAB — CBC WITH DIFFERENTIAL/PLATELET
Basophils Absolute: 0 10*3/uL (ref 0.0–0.1)
HCT: 40.9 % (ref 39.0–52.0)
Lymphocytes Relative: 45 % (ref 12–46)
Neutro Abs: 3.6 10*3/uL (ref 1.7–7.7)
Neutrophils Relative %: 46 % (ref 43–77)
Platelets: 255 10*3/uL (ref 150–400)
RDW: 13.9 % (ref 11.5–15.5)
WBC: 7.8 10*3/uL (ref 4.0–10.5)

## 2013-07-12 MED ORDER — AMOXICILLIN 500 MG PO CAPS: 500.0000 mg | ORAL_CAPSULE | Freq: Three times a day (TID) | ORAL | Status: DC

## 2013-07-12 NOTE — ED Notes (Signed)
Patient states two bumps in right groin x 1 month, denies drainage or pain associated

## 2013-07-12 NOTE — ED Provider Notes (Addendum)
CSN: [id number]     Arrival date & time 07/12/13  0539 History   First MD Initiated Contact with Patient 07/12/13 0550     Chief Complaint  Patient presents with  . Lymphadenopathy   (Consider location/radiation/quality/duration/timing/severity/associated sxs/prior Treatment) The history is provided by the patient. No language interpreter was used.  Patient has had 2 bumps in the right groin which are non painful for at least a month.  No fevers, no weight loss, no night sweats.  No dysuria not penile discharge.  No redness.  No foreign travel since 2001.  NO COUGH  History reviewed. No pertinent past medical history. Past Surgical History  Procedure Laterality Date  . Appendectomy     No family history on file. History  Substance Use Topics  . Smoking status: Current Every Day Smoker  . Smokeless tobacco: Not on file  . Alcohol Use: Yes    Review of Systems  Constitutional: Negative for fever, chills and unexpected weight change.  Gastrointestinal: Negative for abdominal pain.  Genitourinary: Negative for dysuria, discharge and genital sores.  Musculoskeletal: Negative for neck pain and neck stiffness.  Hematological: Positive for adenopathy. Does not bruise/bleed easily.  All other systems reviewed and are negative.    Allergies  Review of patient's allergies indicates no known allergies.  Home Medications   Current Outpatient Rx  Name  Route  Sig  Dispense  Refill  . amoxicillin (AMOXIL) 500 MG capsule   Oral   Take 1 capsule (500 mg total) by mouth 3 (three) times daily.   21 capsule   0    BP 115/60  Pulse 70  Temp(Src) 97.7 F (36.5 C) (Oral)  Resp 18  SpO2 98% Physical Exam  Constitutional: He is oriented to person, place, and time. He appears well-developed and well-nourished. No distress.  HENT:  Head: Normocephalic and atraumatic.  Mouth/Throat: Oropharynx is clear and moist.  Eyes: Conjunctivae are normal. Pupils are equal, round, and reactive to  light.  Neck: Normal range of motion. Neck supple.  SHOTTY NODES < 1 CM FREELY MOBILE NOT MATTED DOWN, NO AXILLARY NODES; NO EPITROCHLEAR NODES 2 RIGHT GROIN NODES FREELY MOBILE APPROXIMATELY 1 CM   Cardiovascular: Normal rate and regular rhythm.   Pulmonary/Chest: Effort normal and breath sounds normal. He has no wheezes. He has no rales.  Abdominal: Soft. Bowel sounds are normal. There is no tenderness. There is no rebound and no guarding.  Musculoskeletal: Normal range of motion.  Lymphadenopathy:    He has cervical adenopathy.  Neurological: He is alert and oriented to person, place, and time.  Skin: Skin is warm and dry.  Psychiatric: He has a normal mood and affect.    ED Course  Procedures (including critical care time) Labs Review Labs Reviewed  POCT I-STAT, CHEM 8 - Abnormal; Notable for the following:    Potassium 3.4 (*)    Calcium, Ion 1.09 (*)    All other components within normal limits  CBC WITH DIFFERENTIAL   Imaging Review Dg Chest 2 View  07/12/2013   CLINICAL DATA:  Cough; lymphadenopathy.  EXAM: CHEST  2 VIEW  COMPARISON:  Chest radiograph performed 07/30/2012  FINDINGS: The lungs are well-aerated and clear. There is no evidence of focal opacification, pleural effusion or pneumothorax.  The heart is normal in size; the mediastinal contour is within normal limits. No acute osseous abnormalities are seen.  IMPRESSION: No acute cardiopulmonary process seen.   Electronically Signed   By: Beryle Beams.D.  On: 07/12/2013 06:56    EKG Interpretation   None       MDM   1. Lymphadenopathy    NO BLASTS ON CBC, NO IMMATURE CELLS NO MEDIASTINAL MASS, NO CLASS B SYMPTOMS.  WILL TREAT WITH ONE WEEK OF ANTIBIOTICS AND HAVE CLOSE FOLLOW UP.  RETURN FOR FEVERS, PAIN WEIGHT LOSS OR NIGHT SWEATS    Cliffie Gingras K Tajuanna Burnett-Rasch, MD 07/12/13 1610  Fabiana Dromgoole K Kaoru Rezendes-Rasch, MD 07/12/13 [phone]

## 2014-05-22 ENCOUNTER — Encounter (HOSPITAL_COMMUNITY): Payer: Self-pay | Admitting: Emergency Medicine

## 2014-05-22 ENCOUNTER — Emergency Department (HOSPITAL_COMMUNITY)
Admission: EM | Admit: 2014-05-22 | Discharge: 2014-05-22 | Disposition: A | Discharge status: Home / Self Care | Payer: BC Managed Care – PPO | Attending: Emergency Medicine | Admitting: Emergency Medicine

## 2014-05-22 ENCOUNTER — Emergency Department (HOSPITAL_COMMUNITY): Payer: BC Managed Care – PPO

## 2014-05-22 DIAGNOSIS — R10811 Right upper quadrant abdominal tenderness: Secondary | ICD-10-CM | POA: Diagnosis not present

## 2014-05-22 DIAGNOSIS — X58XXXA Exposure to other specified factors, initial encounter: Secondary | ICD-10-CM | POA: Diagnosis not present

## 2014-05-22 DIAGNOSIS — Y9389 Activity, other specified: Secondary | ICD-10-CM | POA: Insufficient documentation

## 2014-05-22 DIAGNOSIS — S29011A Strain of muscle and tendon of front wall of thorax, initial encounter: Secondary | ICD-10-CM | POA: Diagnosis not present

## 2014-05-22 DIAGNOSIS — R011 Cardiac murmur, unspecified: Secondary | ICD-10-CM | POA: Insufficient documentation

## 2014-05-22 DIAGNOSIS — S29001A Unspecified injury of muscle and tendon of front wall of thorax, initial encounter: Secondary | ICD-10-CM | POA: Diagnosis present

## 2014-05-22 DIAGNOSIS — Y9289 Other specified places as the place of occurrence of the external cause: Secondary | ICD-10-CM | POA: Insufficient documentation

## 2014-05-22 DIAGNOSIS — Z720 Tobacco use: Secondary | ICD-10-CM | POA: Diagnosis not present

## 2014-05-22 DIAGNOSIS — Z791 Long term (current) use of non-steroidal anti-inflammatories (NSAID): Secondary | ICD-10-CM | POA: Insufficient documentation

## 2014-05-22 DIAGNOSIS — S29002A Unspecified injury of muscle and tendon of back wall of thorax, initial encounter: Secondary | ICD-10-CM | POA: Diagnosis not present

## 2014-05-22 LAB — CBC
HCT: 43.7 % (ref 39.0–52.0)
HEMOGLOBIN: 14.8 g/dL (ref 13.0–17.0)
MCH: 30.3 pg (ref 26.0–34.0)
MCHC: 33.9 g/dL (ref 30.0–36.0)
MCV: 89.5 fL (ref 78.0–100.0)
Platelets: 255 10*3/uL (ref 150–400)
RBC: 4.88 MIL/uL (ref 4.22–5.81)
RDW: 14 % (ref 11.5–15.5)
WBC: 9.4 10*3/uL (ref 4.0–10.5)

## 2014-05-22 LAB — BASIC METABOLIC PANEL
Anion gap: 12 (ref 5–15)
BUN: 11 mg/dL (ref 6–23)
CO2: 28 mEq/L (ref 19–32)
Calcium: 9.6 mg/dL (ref 8.4–10.5)
Chloride: 102 mEq/L (ref 96–112)
Creatinine, Ser: 0.98 mg/dL (ref 0.50–1.35)
GFR calc Af Amer: >90 mL/min (ref >90)
GLUCOSE: 63 mg/dL — AB (ref 70–99)
POTASSIUM: 3.9 meq/L (ref 3.7–5.3)
SODIUM: 142 meq/L (ref 137–147)

## 2014-05-22 LAB — CBG MONITORING, ED: Glucose-Capillary: 102 mg/dL — ABNORMAL HIGH (ref 70–99)

## 2014-05-22 LAB — I-STAT TROPONIN, ED: TROPONIN I, POC: 0.01 ng/mL (ref 0.00–0.08)

## 2014-05-22 MED ORDER — NAPROXEN 500 MG PO TABS: 500.0000 mg | ORAL_TABLET | Freq: Two times a day (BID) | ORAL | Status: DC

## 2014-05-22 MED ORDER — NAPROXEN 250 MG PO TABS
500.0000 mg | ORAL_TABLET | Freq: Once | ORAL | Status: AC
  Administered 2014-05-22: 500 mg via ORAL
  Filled 2014-05-22: qty 2

## 2014-05-22 NOTE — ED Notes (Signed)
Pt presents with right sided chest pain that started this morning, denies SOB or N/V.  Pt admits that pain is worse with movement.  Respirations e/u at present, no distress noted in triage.

## 2014-05-22 NOTE — ED Notes (Signed)
Lungs are clear on exam.  Patient aware of normal lab findings.  Patient with no recent trauma.  No colds.  Explained discharge instructions

## 2014-05-22 NOTE — ED Provider Notes (Signed)
Medical screening examination/treatment/procedure(s) were performed by non-physician practitioner and as supervising physician I was immediately available for consultation/collaboration.   EKG Interpretation   Date/Time:  Wednesday May 22 2014 00:16:23 EDT Ventricular Rate:  75 PR Interval:  192 QRS Duration: 92 QT Interval:  362 QTC Calculation: 404 R Axis:   114 Text Interpretation:  Normal sinus rhythm Normal ECG No significant change  since last tracing Confirmed by Erroll Lunani, Elyjah Hazan Ayokunle [phone] on  05/22/2014 7:14:33 AM        Tomasita CrumbleAdeleke Terrie Grajales, MD 05/22/14 [phone]

## 2014-05-22 NOTE — ED Provider Notes (Signed)
CSN: [id number]     Arrival date & time 05/22/14  0011 History   First MD Initiated Contact with Patient 05/22/14 0244     Chief Complaint  Patient presents with  . Chest Pain    (Consider location/radiation/quality/duration/timing/severity/associated sxs/prior Treatment) Patient is a 28 y.o. male presenting with chest pain. The history is provided by the patient. No language interpreter was used.  Chest Pain Pain location:  R chest (and right back behind shoulder blade) Pain quality: sharp   Pain quality: no pressure and not throbbing   Pain radiates to the back: no (present in both at same time)   Pain severity:  Mild Onset quality:  Gradual (Onset after heavy lifting at work Monday night; present upon waking Tuesday AM) Duration:  1 day Timing:  Intermittent Progression:  Waxing and waning Chronicity:  New Relieved by:  None tried Worsened by:  Deep breathing and movement Ineffective treatments:  None tried Associated symptoms: back pain   Associated symptoms: no diaphoresis, no fever, no nausea, no numbness, no shortness of breath, not vomiting and no weakness   Risk factors: male sex and smoking   Risk factors: no coronary artery disease, no diabetes mellitus, no hypertension and not obese     History reviewed. No pertinent past medical history. Past Surgical History  Procedure Laterality Date  . Appendectomy     No family history on file. History  Substance Use Topics  . Smoking status: Current Every Day Smoker  . Smokeless tobacco: Not on file  . Alcohol Use: Yes    Review of Systems  Constitutional: Negative for fever and diaphoresis.  Respiratory: Negative for shortness of breath.   Cardiovascular: Positive for chest pain.  Gastrointestinal: Negative for nausea and vomiting.  Musculoskeletal: Positive for back pain.  Neurological: Negative for syncope, weakness and numbness.  All other systems reviewed and are negative.   Allergies  Review of patient's  allergies indicates no known allergies.  Home Medications   Prior to Admission medications   Medication Sig Start Date End Date Taking? Authorizing Provider  naproxen (NAPROSYN) 500 MG tablet Take 1 tablet (500 mg total) by mouth 2 (two) times daily. 05/22/14   Antony MaduraKelly Derrik Mceachern, PA-C   BP 126/73  Pulse 65  Temp(Src) 98.3 F (36.8 C) (Oral)  Resp 18  Ht 5\' 9"  (1.753 m)  Wt 135 lb (61.236 kg)  BMI 19.93 kg/m2  SpO2 99%  Physical Exam  Nursing note and vitals reviewed. Constitutional: He is oriented to person, place, and time. He appears well-developed and well-nourished. No distress.  Nontoxic/nonseptic appearing  HENT:  Head: Normocephalic and atraumatic.  Eyes: Conjunctivae and EOM are normal. No scleral icterus.  Neck: Normal range of motion.  No JVD  Cardiovascular: Normal rate, regular rhythm and intact distal pulses.   Murmur (II/VI SEM) heard. Pulmonary/Chest: Effort normal and breath sounds normal. No respiratory distress. He has no wheezes. He has no rales. He exhibits tenderness. He exhibits no bony tenderness, no crepitus and no deformity.    Chest expansion symmetric. No tachypnea or dyspnea. No crepitus on palpation.  Musculoskeletal: Normal range of motion. He exhibits tenderness.  Tenderness to palpation of right thoracic paraspinal muscles along medial border of the scapula.  Neurological: He is alert and oriented to person, place, and time. He exhibits normal muscle tone. Coordination normal.  GCS 15. Speech is goal oriented  Skin: Skin is warm and dry. No rash noted. He is not diaphoretic. No erythema. No pallor.  Psychiatric:  He has a normal mood and affect. His behavior is normal.    ED Course  Procedures (including critical care time) Labs Review Labs Reviewed  BASIC METABOLIC PANEL - Abnormal; Notable for the following:    Glucose, Bld 63 (*)    All other components within normal limits  CBC  I-STAT TROPOININ, ED    Imaging Review Dg Chest 2  View  05/22/2014   CLINICAL DATA:  28 year old male with acute right chest pain radiating to the shoulder in shortness of Breath. Initial encounter.  EXAM: CHEST  2 VIEW  COMPARISON:  07/12/2013 and earlier.  FINDINGS: Incidental hair artifact. Normal lung volumes. Normal cardiac size and mediastinal contours. Visualized tracheal air column is within normal limits. Lungs remain clear. No pneumothorax or effusion. No osseous abnormality identified.  IMPRESSION: Negative, no acute cardiopulmonary abnormality.   Electronically Signed   By: Augusto GambleLee  Hall M.D.   On: 05/22/2014 00:40     EKG Interpretation None      MDM   Final diagnoses:  Chest wall muscle strain, initial encounter    28 year old male presents to the emergency department for further evaluation of chest pain. Chest pain began yesterday morning. Patient endorses doing heavy lifting at work the night prior. Pain worse with movement of right arm as well as deep breathing. Pain reproducible on palpation. Cardiac workup negative. Doubt ACS given age and lack of risk factors as well as atypical nature of symptoms. Heart score 1 c/w low risk of MACE. No evidence of pneumonia, pleural effusion, pneumothorax, or mediastinal widening. Symptoms consistent with muscle strain. Will manage as outpatient with Naproxen, ice, and rest. Patient requesting cardiology f/u for further evaluation of murmur, though he does endorse a history of cardiac murmur as a child. Return precautions discussed and provided. Patient agreeable to plan with no unaddressed concerns. Patient discharged in good condition.   Filed Vitals:   05/22/14 0020  BP: 126/73  Pulse: 65  Temp: 98.3 F (36.8 C)  TempSrc: Oral  Resp: 18  Height: 5\' 9"  (1.753 m)  Weight: 135 lb (61.236 kg)  SpO2: 99%     Antony MaduraKelly Elery Cadenhead, PA-C 05/22/14 [phone]

## 2014-05-22 NOTE — Discharge Instructions (Signed)
Muscle Strain A muscle strain is an injury that occurs when a muscle is stretched beyond its normal length. Usually a small number of muscle fibers are torn when this happens. Muscle strain is rated in degrees. First-degree strains have the least amount of muscle fiber tearing and pain. Second-degree and third-degree strains have increasingly more tearing and pain.  Usually, recovery from muscle strain takes 1-2 weeks. Complete healing takes 5-6 weeks.  CAUSES  Muscle strain happens when a sudden, violent force placed on a muscle stretches it too far. This may occur with lifting, sports, or a fall.  RISK FACTORS Muscle strain is especially common in athletes.  SIGNS AND SYMPTOMS At the site of the muscle strain, there may be:  Pain.  Bruising.  Swelling.  Difficulty using the muscle due to pain or lack of normal function. DIAGNOSIS  Your health care provider will perform a physical exam and ask about your medical history. TREATMENT  Often, the best treatment for a muscle strain is resting, icing, and applying cold compresses to the injured area.  HOME CARE INSTRUCTIONS   Use the PRICE method of treatment to promote muscle healing during the first 2-3 days after your injury. The PRICE method involves:  Protecting the muscle from being injured again.  Restricting your activity and resting the injured body part.  Icing your injury. To do this, put ice in a plastic bag. Place a towel between your skin and the bag. Then, apply the ice and leave it on from 15-20 minutes each hour. After the third day, switch to moist heat packs.  Apply compression to the injured area with a splint or elastic bandage. Be careful not to wrap it too tightly. This may interfere with blood circulation or increase swelling.  Elevate the injured body part above the level of your heart as often as you can.  Only take over-the-counter or prescription medicines for pain, discomfort, or fever as directed by your  health care provider.  Warming up prior to exercise helps to prevent future muscle strains. SEEK MEDICAL CARE IF:   You have increasing pain or swelling in the injured area.  You have numbness, tingling, or a significant loss of strength in the injured area. MAKE SURE YOU:   Understand these instructions.  Will watch your condition.  Will get help right away if you are not doing well or get worse. Document Released: 07/19/2005 Document Revised: 05/09/2013 Document Reviewed: 02/15/2013 [facility]ExitCare Patient Information 2015 [place], [place]. This information is not intended to replace advice given to you by your health care provider. Make sure you discuss any questions you have with your health care provider.  Heart Murmur A heart murmur is an extra sound heard by your health care provider when listening to your heart with a device called a stethoscope. The sound comes from turbulence when blood flows through the heart and may be a "hum" or "whoosh" sound heard when the heart beats. There are two types of heart murmurs:  Innocent murmurs. Most people with this type of heart murmur do not have a heart problem. Many children have innocent heart murmurs. Your health care provider may suggest some basic testing to know whether your murmur is an innocent murmur. If an innocent heart murmur is found, there is no need for further tests or treatment and no need to restrict activities or stop playing sports.  Abnormal murmurs. These types of murmurs can occur in children and adults. In children, abnormal heart murmurs are typically caused  from heart defects that are present at birth (congenital). In adults, abnormal murmurs are usually from heart valve problems caused by disease, infection, or aging. CAUSES  All heart murmurs are a result of an issue with your heart valves. Normally, these valves open to let blood flow through or out of your heart and then shut to keep it from flowing backward. If they do  not work properly, you could have:  Regurgitation--When blood leaks back through the valve in the wrong direction.  Mitral valve prolapse--When the mitral valve of the heart has a loose flap and does not close tightly.  Stenosis--When the valve does not open enough and blocks blood flow. SIGNS AND SYMPTOMS  Innocent murmurs do not cause symptoms, and many people with abnormal murmurs may or may not have symptoms. If symptoms do develop, they may include:  Shortness of breath.  Blue coloring of the skin, especially on the fingertips.  Chest pain.  Palpitations, or feeling a fluttering or skipped heartbeat.  Fainting.  Persistent cough.  Getting tired much faster than expected. DIAGNOSIS  A heart murmur might be heard during a sports physical or during any type of examination. When a murmur is heard, it may suggest a possible problem. When this happens, your health care provider may ask you to see a heart specialist (cardiologist). You may also be asked to have one or more heart tests. In these cases, testing may vary depending on what your health care provider heard. Tests for a heart murmur may include:  Electrocardiogram.  Echocardiogram.  MRI. For children and adults who have an abnormal heart murmur and want to play sports, it is important to complete testing, review test results, and receive recommendations from your health care provider. If heart disease is present, it may not be safe to play. TREATMENT  Innocent murmurs require no treatment or activity restriction. If an abnormal murmur represents a problem with the heart, treatment will depend on the exact nature of the problem. In these cases, medicine or surgery may be needed to treat the problem. HOME CARE INSTRUCTIONS If you want to participate in sports or other types of strenuous physical activity, it is important to discuss this first with your health care provider. If the murmur represents a problem with the heart  and you choose to participate in sports, there is a small chance that a serious problem (including sudden death) could result.  SEEK MEDICAL CARE IF:   You feel that your symptoms are slowly worsening.  You develop any new symptoms that cause concern.  You feel that you are having side effects from any medicines prescribed. SEEK IMMEDIATE MEDICAL CARE IF:   You develop chest pain.  You have shortness of breath.  You notice that your heart beats irregularly often enough to cause you to worry.  You have fainting spells.  Your symptoms suddenly get worse. Document Released: 08/26/2004 Document Revised: 07/24/2013 Document Reviewed: 03/26/2013 [facility] CenterExitCare Patient Information 2015 [place], [place]. This information is not intended to replace advice given to you by your health care provider. Make sure you discuss any questions you have with your health care provider.

## 2014-11-21 ENCOUNTER — Encounter (HOSPITAL_COMMUNITY): Payer: Self-pay | Admitting: Emergency Medicine

## 2014-11-21 ENCOUNTER — Emergency Department (HOSPITAL_COMMUNITY)
Admission: EM | Admit: 2014-11-21 | Discharge: 2014-11-21 | Disposition: A | Discharge status: Home / Self Care | Payer: BLUE CROSS/BLUE SHIELD | Attending: Emergency Medicine | Admitting: Emergency Medicine

## 2014-11-21 DIAGNOSIS — Z720 Tobacco use: Secondary | ICD-10-CM | POA: Insufficient documentation

## 2014-11-21 DIAGNOSIS — K5900 Constipation, unspecified: Secondary | ICD-10-CM | POA: Insufficient documentation

## 2014-11-21 DIAGNOSIS — Z791 Long term (current) use of non-steroidal anti-inflammatories (NSAID): Secondary | ICD-10-CM | POA: Insufficient documentation

## 2014-11-21 DIAGNOSIS — R109 Unspecified abdominal pain: Secondary | ICD-10-CM

## 2014-11-21 DIAGNOSIS — R1033 Periumbilical pain: Secondary | ICD-10-CM | POA: Diagnosis not present

## 2014-11-21 LAB — COMPREHENSIVE METABOLIC PANEL
ALK PHOS: 85 U/L (ref 39–117)
ALT: 20 U/L (ref 0–53)
AST: 28 U/L (ref 0–37)
Albumin: 4.4 g/dL (ref 3.5–5.2)
Anion gap: 11 (ref 5–15)
BILIRUBIN TOTAL: 0.5 mg/dL (ref 0.3–1.2)
BUN: 13 mg/dL (ref 6–23)
CHLORIDE: 101 mmol/L (ref 96–112)
CO2: 28 mmol/L (ref 19–32)
Calcium: 9.4 mg/dL (ref 8.4–10.5)
Creatinine, Ser: 0.82 mg/dL (ref 0.50–1.35)
GFR calc Af Amer: >90 mL/min (ref >90)
GFR calc non Af Amer: >90 mL/min (ref >90)
Glucose, Bld: 102 mg/dL — ABNORMAL HIGH (ref 70–99)
Potassium: 3.8 mmol/L (ref 3.5–5.1)
SODIUM: 140 mmol/L (ref 135–145)
Total Protein: 7.2 g/dL (ref 6.0–8.3)

## 2014-11-21 LAB — URINALYSIS, ROUTINE W REFLEX MICROSCOPIC
Bilirubin Urine: NEGATIVE
GLUCOSE, UA: NEGATIVE mg/dL
Hgb urine dipstick: NEGATIVE
Ketones, ur: NEGATIVE mg/dL
Leukocytes, UA: NEGATIVE
Nitrite: NEGATIVE
Protein, ur: NEGATIVE mg/dL
SPECIFIC GRAVITY, URINE: 1.022 (ref 1.005–1.030)
UROBILINOGEN UA: 1 mg/dL (ref 0.0–1.0)
pH: 7 (ref 5.0–8.0)

## 2014-11-21 LAB — CBC WITH DIFFERENTIAL/PLATELET
BASOS PCT: 0 % (ref 0–1)
Basophils Absolute: 0 10*3/uL (ref 0.0–0.1)
EOS PCT: 2 % (ref 0–5)
Eosinophils Absolute: 0.2 10*3/uL (ref 0.0–0.7)
HEMATOCRIT: 41.4 % (ref 39.0–52.0)
Hemoglobin: 13.9 g/dL (ref 13.0–17.0)
LYMPHS ABS: 3.1 10*3/uL (ref 0.7–4.0)
LYMPHS PCT: 42 % (ref 12–46)
MCH: 30.1 pg (ref 26.0–34.0)
MCHC: 33.6 g/dL (ref 30.0–36.0)
MCV: 89.6 fL (ref 78.0–100.0)
MONO ABS: 0.6 10*3/uL (ref 0.1–1.0)
MONOS PCT: 8 % (ref 3–12)
Neutro Abs: 3.5 10*3/uL (ref 1.7–7.7)
Neutrophils Relative %: 48 % (ref 43–77)
Platelets: 248 10*3/uL (ref 150–400)
RBC: 4.62 MIL/uL (ref 4.22–5.81)
RDW: 14.1 % (ref 11.5–15.5)
WBC: 7.4 10*3/uL (ref 4.0–10.5)

## 2014-11-21 LAB — LIPASE, BLOOD: LIPASE: 29 U/L (ref 11–59)

## 2014-11-21 NOTE — ED Notes (Signed)
Pt. reports intermittent RLQ pain onset 2 weeks ago , denies nausea or vomitting . No diarrhea or fever .

## 2014-11-21 NOTE — ED Provider Notes (Signed)
CSN: [id number]     Arrival date & time 11/21/14  0330 History   First MD Initiated Contact with Patient 11/21/14 [phone]     Chief Complaint  Patient presents with  . Abdominal Pain     (Consider location/radiation/quality/duration/timing/severity/associated sxs/prior Treatment) Patient is a 29 y.o. male presenting with abdominal pain.  Abdominal Pain Pain location:  Periumbilical Pain quality: aching   Pain radiates to:  Does not radiate Pain severity:  Mild Onset quality:  Gradual Duration:  2 weeks Timing:  Intermittent Progression:  Waxing and waning Chronicity:  New Context comment:  Prior appendectomy, started after heavy lifting at work Relieved by:  Nothing Worsened by:  Movement, palpation and bowel movements Ineffective treatments:  None tried Associated symptoms: constipation (2 weeks ago, now resolved)   Associated symptoms: no anorexia, no cough, no diarrhea, no dysuria, no fever, no nausea and no vomiting     History reviewed. No pertinent past medical history. Past Surgical History  Procedure Laterality Date  . Appendectomy     No family history on file. History  Substance Use Topics  . Smoking status: Current Every Day Smoker  . Smokeless tobacco: Not on file  . Alcohol Use: Yes    Review of Systems  Constitutional: Negative for fever.  Respiratory: Negative for cough.   Gastrointestinal: Positive for abdominal pain and constipation (2 weeks ago, now resolved). Negative for nausea, vomiting, diarrhea and anorexia.  Genitourinary: Negative for dysuria and testicular pain.  All other systems reviewed and are negative.     Allergies  Review of patient's allergies indicates no known allergies.  Home Medications   Prior to Admission medications   Medication Sig Start Date End Date Taking? Authorizing Provider  naproxen (NAPROSYN) 500 MG tablet Take 1 tablet (500 mg total) by mouth 2 (two) times daily. 05/22/14   Antony MaduraKelly Humes, PA-C   BP 121/81 mmHg   Pulse 65  Temp(Src) 98.3 F (36.8 C) (Oral)  Resp 19  SpO2 100% Physical Exam  Constitutional: He is oriented to person, place, and time. He appears well-developed and well-nourished.  HENT:  Head: Normocephalic and atraumatic.  Eyes: Conjunctivae and EOM are normal.  Neck: Normal range of motion. Neck supple.  Cardiovascular: Normal rate, regular rhythm and normal heart sounds.   Pulmonary/Chest: Effort normal and breath sounds normal. No respiratory distress.  Abdominal: He exhibits no distension. There is tenderness (R peirumbilical) in the periumbilical area. There is no rebound and no guarding.  Musculoskeletal: Normal range of motion.  Neurological: He is alert and oriented to person, place, and time.  Skin: Skin is warm and dry.  Vitals reviewed.   ED Course  Procedures (including critical care time) Labs Review Labs Reviewed  COMPREHENSIVE METABOLIC PANEL - Abnormal; Notable for the following:    Glucose, Bld 102 (*)    All other components within normal limits  CBC WITH DIFFERENTIAL/PLATELET  LIPASE, BLOOD  URINALYSIS, ROUTINE W REFLEX MICROSCOPIC    Imaging Review No results found.   EKG Interpretation None      MDM   Final diagnoses:  Abdominal pain, acute    29 y.o. male with pertinent PMH of prior appendectomy presents with abd pain as above after lifting.  Exam with likely very small hernia periumbilically, reducible.  No testicular complaints.  Will have pt fu with PCP to ensure no change and standard return precautions given.    I have reviewed all laboratory and imaging studies if ordered as above  1. Abdominal pain,  acute         Mirian Mo, MD 11/21/14 [phone]

## 2014-11-21 NOTE — Discharge Instructions (Signed)
Abdominal Pain °Many things can cause abdominal pain. Usually, abdominal pain is not caused by a disease and will improve without treatment. It can often be observed and treated at home. Your health care provider will do a physical exam and possibly order blood tests and X-rays to help determine the seriousness of your pain. However, in many cases, more time must pass before a clear cause of the pain can be found. Before that point, your health care provider may not know if you need more testing or further treatment. °HOME CARE INSTRUCTIONS  °Monitor your abdominal pain for any changes. The following actions may help to alleviate any discomfort you are experiencing: °· Only take over-the-counter or prescription medicines as directed by your health care provider. °· Do not take laxatives unless directed to do so by your health care provider. °· Try a clear liquid diet (broth, tea, or water) as directed by your health care provider. Slowly move to a bland diet as tolerated. °SEEK MEDICAL CARE IF: °· You have unexplained abdominal pain. °· You have abdominal pain associated with nausea or diarrhea. °· You have pain when you urinate or have a bowel movement. °· You experience abdominal pain that wakes you in the night. °· You have abdominal pain that is worsened or improved by eating food. °· You have abdominal pain that is worsened with eating fatty foods. °· You have a fever. °SEEK IMMEDIATE MEDICAL CARE IF:  °· Your pain does not go away within 2 hours. °· You keep throwing up (vomiting). °· Your pain is felt only in portions of the abdomen, such as the right side or the left lower portion of the abdomen. °· You pass bloody or black tarry stools. °MAKE SURE YOU: °· Understand these instructions.   °· Will watch your condition.   °· Will get help right away if you are not doing well or get worse.   °Document Released: 04/28/2005 Document Revised: 07/24/2013 Document Reviewed: 03/28/2013 °ExitCare® Patient Information  ©2015 [employer]. This information is not intended to replace advice given to you by your health care provider. Make sure you discuss any questions you have with your health care provider. ° °Hernia °A hernia occurs when an internal organ pushes out through a weak spot in the abdominal wall. Hernias most commonly occur in the groin and around the navel. Hernias often can be pushed back into place (reduced). Most hernias tend to get worse over time. Some abdominal hernias can get stuck in the opening (irreducible or incarcerated hernia) and cannot be reduced. An irreducible abdominal hernia which is tightly squeezed into the opening is at risk for impaired blood supply (strangulated hernia). A strangulated hernia is a medical emergency. Because of the risk for an irreducible or strangulated hernia, surgery may be recommended to repair a hernia. °CAUSES  °· Heavy lifting. °· Prolonged coughing. °· Straining to have a bowel movement. °· A cut (incision) made during an abdominal surgery. °HOME CARE INSTRUCTIONS  °· Bed rest is not required. You may continue your normal activities. °· Avoid lifting more than 10 pounds (4.5 kg) or straining. °· Cough gently. If you are a smoker it is best to stop. Even the best hernia repair can break down with the continual strain of coughing. Even if you do not have your hernia repaired, a cough will continue to aggravate the problem. °· Do not wear anything tight over your hernia. Do not try to keep it in with an outside bandage or truss. These can damage abdominal   contents if they are trapped within the hernia sac. °· Eat a normal diet. °· Avoid constipation. Straining over long periods of time will increase hernia size and encourage breakdown of repairs. If you cannot do this with diet alone, stool softeners may be used. °SEEK IMMEDIATE MEDICAL CARE IF:  °· You have a fever. °· You develop increasing abdominal pain. °· You feel nauseous or vomit. °· Your hernia is stuck outside  the abdomen, looks discolored, feels hard, or is tender. °· You have any changes in your bowel habits or in the hernia that are unusual for you. °· You have increased pain or swelling around the hernia. °· You cannot push the hernia back in place by applying gentle pressure while lying down. °MAKE SURE YOU:  °· Understand these instructions. °· Will watch your condition. °· Will get help right away if you are not doing well or get worse. °Document Released: 07/19/2005 Document Revised: 10/11/2011 Document Reviewed: 03/07/2008 °ExitCare® Patient Information ©2015 [employer]. This information is not intended to replace advice given to you by your health care provider. Make sure you discuss any questions you have with your health care provider. ° °

## 2014-11-25 ENCOUNTER — Emergency Department (HOSPITAL_COMMUNITY)
Admission: EM | Admit: 2014-11-25 | Discharge: 2014-11-26 | Disposition: A | Discharge status: Home / Self Care | Payer: BLUE CROSS/BLUE SHIELD | Attending: Emergency Medicine | Admitting: Emergency Medicine

## 2014-11-25 ENCOUNTER — Encounter (HOSPITAL_COMMUNITY): Payer: Self-pay | Admitting: Emergency Medicine

## 2014-11-25 DIAGNOSIS — R0600 Dyspnea, unspecified: Secondary | ICD-10-CM | POA: Diagnosis not present

## 2014-11-25 DIAGNOSIS — R202 Paresthesia of skin: Secondary | ICD-10-CM | POA: Insufficient documentation

## 2014-11-25 DIAGNOSIS — R0602 Shortness of breath: Secondary | ICD-10-CM | POA: Diagnosis present

## 2014-11-25 DIAGNOSIS — Z720 Tobacco use: Secondary | ICD-10-CM | POA: Insufficient documentation

## 2014-11-25 DIAGNOSIS — R42 Dizziness and giddiness: Secondary | ICD-10-CM | POA: Insufficient documentation

## 2014-11-25 LAB — BASIC METABOLIC PANEL
Anion gap: 11 (ref 5–15)
BUN: 11 mg/dL (ref 6–23)
CO2: 25 mmol/L (ref 19–32)
CREATININE: 0.78 mg/dL (ref 0.50–1.35)
Calcium: 9.4 mg/dL (ref 8.4–10.5)
Chloride: 102 mmol/L (ref 96–112)
GFR calc Af Amer: >90 mL/min (ref >90)
GFR calc non Af Amer: >90 mL/min (ref >90)
Glucose, Bld: 97 mg/dL (ref 70–99)
Potassium: 3.6 mmol/L (ref 3.5–5.1)
Sodium: 138 mmol/L (ref 135–145)

## 2014-11-25 LAB — CBC
HCT: 41.4 % (ref 39.0–52.0)
Hemoglobin: 14.3 g/dL (ref 13.0–17.0)
MCH: 30.7 pg (ref 26.0–34.0)
MCHC: 34.5 g/dL (ref 30.0–36.0)
MCV: 88.8 fL (ref 78.0–100.0)
PLATELETS: 230 10*3/uL (ref 150–400)
RBC: 4.66 MIL/uL (ref 4.22–5.81)
RDW: 14.1 % (ref 11.5–15.5)
WBC: 4.9 10*3/uL (ref 4.0–10.5)

## 2014-11-25 LAB — I-STAT TROPONIN, ED: Troponin i, poc: 0 ng/mL (ref 0.00–0.08)

## 2014-11-25 NOTE — ED Notes (Signed)
Patient states he began to experience shortness of breath followed by left arm numbness last night at work. 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 Reports that tonight he began to feel dizzy as well. Denies hx of cardiac problems, but states hx of heart murmur.

## 2014-11-26 NOTE — Discharge Instructions (Signed)
Try to stop smoking. Avoid drinking alcohol before going to sleep.   Shortness of Breath Shortness of breath means you have trouble breathing. It could also mean that you have a medical problem. You should get immediate medical care for shortness of breath. CAUSES   Not enough oxygen in the air such as with high altitudes or a smoke-filled room.  Certain lung diseases, infections, or problems.  Heart disease or conditions, such as angina or heart failure.  Low red blood cells (anemia).  Poor physical fitness, which can cause shortness of breath when you exercise.  Chest or back injuries or stiffness.  Being overweight.  Smoking.  Anxiety, which can make you feel like you are not getting enough air. DIAGNOSIS  Serious medical problems can often be found during your physical exam. Tests may also be done to determine why you are having shortness of breath. Tests may include:  Chest X-rays.  Lung function tests.  Blood tests.  An electrocardiogram (ECG).  An ambulatory electrocardiogram. An ambulatory ECG records your heartbeat patterns over a 24-hour period.  Exercise testing.  A transthoracic echocardiogram (TTE). During echocardiography, sound waves are used to evaluate how blood flows through your heart.  A transesophageal echocardiogram (TEE).  Imaging scans. Your health care provider may not be able to find a cause for your shortness of breath after your exam. In this case, it is important to have a follow-up exam with your health care provider as directed.  TREATMENT  Treatment for shortness of breath depends on the cause of your symptoms and can vary greatly. HOME CARE INSTRUCTIONS   Do not smoke. Smoking is a common cause of shortness of breath. If you smoke, ask for help to quit.  Avoid being around chemicals or things that may bother your breathing, such as paint fumes and dust.  Rest as needed. Slowly resume your usual activities.  If medicines were  prescribed, take them as directed for the full length of time directed. This includes oxygen and any inhaled medicines.  Keep all follow-up appointments as directed by your health care provider. SEEK MEDICAL CARE IF:   Your condition does not improve in the time expected.  You have a hard time doing your normal activities even with rest.  You have any new symptoms. SEEK IMMEDIATE MEDICAL CARE IF:   Your shortness of breath gets worse.  You feel light-headed, faint, or develop a cough not controlled with medicines.  You start coughing up blood.  You have pain with breathing.  You have chest pain or pain in your arms, shoulders, or abdomen.  You have a fever.  You are unable to walk up stairs or exercise the way you normally do. MAKE SURE YOU:  Understand these instructions.  Will watch your condition.  Will get help right away if you are not doing well or get worse. Document Released: 04/13/2001 Document Revised: 07/24/2013 Document Reviewed: 10/04/2011 [facility] Patient Information 2015 [place], [place]. This information is not intended to replace advice given to you by your health care provider. Make sure you discuss any questions you have with your health care provider.  Smoking Hazards Smoking cigarettes is extremely bad for your health. Tobacco smoke has over 200 known poisons in it. It contains the poisonous gases nitrogen oxide and carbon monoxide. There are over 60 chemicals in tobacco smoke that cause cancer. Some of the chemicals found in cigarette smoke include:   Cyanide.   Benzene.   Formaldehyde.   Methanol (wood alcohol).  Acetylene (fuel used in welding torches).   Ammonia.  Even smoking lightly shortens your life expectancy by several years. You can greatly reduce the risk of medical problems for you and your family by stopping now. Smoking is the most preventable cause of death and disease in our society. Within days of quitting smoking, your  circulation improves, you decrease the risk of having a heart attack, and your lung capacity improves. There may be some increased phlegm in the first few days after quitting, and it may take months for your lungs to clear up completely. Quitting for 10 years reduces your risk of developing lung cancer to almost that of a nonsmoker.  WHAT ARE THE RISKS OF SMOKING? Cigarette smokers have an increased risk of many serious medical problems, including:  Lung cancer.   Lung disease (such as pneumonia, bronchitis, and emphysema).   Heart attack and chest pain due to the heart not getting enough oxygen (angina).   Heart disease and peripheral blood vessel disease.   Hypertension.   Stroke.   Oral cancer (cancer of the lip, mouth, or voice box).   Bladder cancer.   Pancreatic cancer.   Cervical cancer.   Pregnancy complications, including premature birth.   Stillbirths and smaller newborn babies, birth defects, and genetic damage to sperm.   Early menopause.   Lower estrogen level for women.   Infertility.   Facial wrinkles.   Blindness.   Increased risk of broken bones (fractures).   Senile dementia.   Stomach ulcers and internal bleeding.   Delayed wound healing and increased risk of complications during surgery. Because of secondhand smoke exposure, children of smokers have an increased risk of the following:   Sudden infant death syndrome (SIDS).   Respiratory infections.   Lung cancer.   Heart disease.   Ear infections.  WHY IS SMOKING ADDICTIVE? Nicotine is the chemical agent in tobacco that is capable of causing addiction or dependence. When you smoke and inhale, nicotine is absorbed rapidly into the bloodstream through your lungs. Both inhaled and noninhaled nicotine may be addictive.  WHAT ARE THE BENEFITS OF QUITTING?  There are many health benefits to quitting smoking. Some are:   The likelihood of developing cancer and heart  disease decreases. Health improvements are seen almost immediately.   Blood pressure, pulse rate, and breathing patterns start returning to normal soon after quitting.   People who quit may see an improvement in their overall quality of life.  HOW DO YOU QUIT SMOKING? Smoking is an addiction with both physical and psychological effects, and longtime habits can be hard to change. Your health care provider can recommend:  Programs and community resources, which may include group support, education, or therapy.  Replacement products, such as patches, gum, and nasal sprays. Use these products only as directed. Do not replace cigarette smoking with electronic cigarettes (commonly called e-cigarettes). The safety of e-cigarettes is unknown, and some may contain harmful chemicals. FOR MORE INFORMATION  American Lung Association: www.lung.org  American Cancer Society: www.cancer.org Document Released: 08/26/2004 Document Revised: 05/09/2013 Document Reviewed: 01/08/2013 [facility] Patient Information 2015 [place]. This information is not intended to replace advice given to you by your health care provider. Make sure you discuss any questions you have with your health care provider.

## 2014-11-26 NOTE — ED Provider Notes (Signed)
CSN: [id number]     Arrival date & time 11/25/14  2045 History   First MD Initiated Contact with Patient 11/25/14 2356     Chief Complaint  Patient presents with  . Dizziness  . Shortness of Breath  . Arm Pain     (Consider location/radiation/quality/duration/timing/severity/associated sxs/prior Treatment) HPI   He presents for evaluation of [place] shortness of breath, which occurs both at work in when not working. He also occasionally has a choking sensation at nighttime, which he often after drinking alcohol on the weekends. Today he had some numbness in his left arm, which has since resolved. He denies neck or back pain. He denies fever, chills, cough, weakness or dizziness. He was here one week ago with abdominal pain and diagnosed with an abdominal wall hernia. He states that that discomfort is Better. There are no other known modifying factors.  History reviewed. No pertinent past medical history. Past Surgical History  Procedure Laterality Date  . Appendectomy     History reviewed. No pertinent family history. History  Substance Use Topics  . Smoking status: Current Every Day Smoker  . Smokeless tobacco: Not on file  . Alcohol Use: Yes    Review of Systems  All other systems reviewed and are negative.     Allergies  Review of patient's allergies indicates no known allergies.  Home Medications   Prior to Admission medications   Medication Sig Start Date End Date Taking? Authorizing Provider  ibuprofen (ADVIL,MOTRIN) 200 MG tablet Take 400 mg by mouth every 6 (six) hours as needed for mild pain.   Yes Historical Provider, MD  naproxen (NAPROSYN) 500 MG tablet Take 1 tablet (500 mg total) by mouth 2 (two) times daily. Patient not taking: Reported on 11/25/2014 05/22/14   Antony Madura, PA-C   BP 124/68 mmHg  Pulse 71  Temp(Src) 98 F (36.7 C) (Oral)  Resp 16  Ht  (1.753 m)  Wt 126 lb 4.8 oz (57.289 kg)  BMI 18.64 kg/m2  SpO2 98% Physical Exam   Constitutional: He is oriented to person, place, and time. He appears well-developed and well-nourished. No distress.  HENT:  Head: Normocephalic and atraumatic.  Right Ear: External ear normal.  Left Ear: External ear normal.  Eyes: Conjunctivae and EOM are normal. Pupils are equal, round, and reactive to light.  Neck: Normal range of motion and phonation normal. Neck supple.  Cardiovascular: Normal rate, regular rhythm and normal heart sounds.   Pulmonary/Chest: Effort normal and breath sounds normal. No respiratory distress. He has no wheezes. He exhibits no tenderness and no bony tenderness.  Abdominal: Soft. There is no tenderness.  Musculoskeletal: Normal range of motion.  Neurological: He is alert and oriented to person, place, and time. No cranial nerve deficit or sensory deficit. He exhibits normal muscle tone. Coordination normal.  Skin: Skin is warm, dry and intact.  Psychiatric: He has a normal mood and affect. His behavior is normal. Judgment and thought content normal.  Nursing note and vitals reviewed.   ED Course  Procedures (including critical care time) Medications - No data to display  Patient Vitals for the past 24 hrs:  BP Temp Temp src Pulse Resp SpO2 Height Weight  11/26/14 0008 122/73 mmHg - - 62 11 100 % - -  11/25/14 2100 124/68 mmHg 98 F (36.7 C) Oral 71 16 98 %  (1.753 m) 126 lb 4.8 oz (57.289 kg)    Findings discussed with patient, all questions answered.   Labs  Review Labs Reviewed  CBC  BASIC METABOLIC PANEL  I-STAT TROPOININ, ED    Imaging Review No results found.   EKG Interpretation None         Date: 11/25/14  Rate: 71  Rhythm: normal sinus rhythm  QRS Axis: normal  PR and QT Intervals: normal  ST/T Wave abnormalities: normal  PR and QRS Conduction Disutrbances:none  Narrative Interpretation:   Old EKG Reviewed: unchanged   MDM   Final diagnoses:  Dyspnea  Paresthesia of arm  Tobacco abuse    Nonspecific chest  pain and paresthesia. Doubt cervical neuropathy, PE, ACS or metabolic instability. Possible sleep apnea associated with alcohol in take.   Nursing Notes Reviewed/ Care Coordinated Applicable Imaging Reviewed Interpretation of Laboratory Data incorporated into ED treatment  The patient appears reasonably screened and/or stabilized for discharge and I doubt any other medical condition or other [facility] requiring further screening, evaluation, or treatment in the ED at this time prior to discharge.  Plan: Home Medications- none; Home Treatments- rest; return here if the recommended treatment, does not improve the symptoms; Recommended follow up- PCP eval. In 1-2 weeks     Mancel BaleElliott Basim Bartnik, MD 11/26/14 [id number]

## 2014-12-23 ENCOUNTER — Emergency Department (HOSPITAL_COMMUNITY): Payer: PRIVATE HEALTH INSURANCE

## 2014-12-23 ENCOUNTER — Emergency Department (HOSPITAL_COMMUNITY)
Admission: EM | Admit: 2014-12-23 | Discharge: 2014-12-23 | Disposition: A | Discharge status: Home / Self Care | Payer: PRIVATE HEALTH INSURANCE | Attending: Emergency Medicine | Admitting: Emergency Medicine

## 2014-12-23 ENCOUNTER — Encounter (HOSPITAL_COMMUNITY): Payer: Self-pay

## 2014-12-23 DIAGNOSIS — Z720 Tobacco use: Secondary | ICD-10-CM | POA: Diagnosis not present

## 2014-12-23 DIAGNOSIS — R079 Chest pain, unspecified: Secondary | ICD-10-CM | POA: Diagnosis not present

## 2014-12-23 DIAGNOSIS — K224 Dyskinesia of esophagus: Secondary | ICD-10-CM | POA: Diagnosis not present

## 2014-12-23 LAB — CBC
HCT: 40.6 % (ref 39.0–52.0)
Hemoglobin: 13.7 g/dL (ref 13.0–17.0)
MCH: 30.2 pg (ref 26.0–34.0)
MCHC: 33.7 g/dL (ref 30.0–36.0)
MCV: 89.4 fL (ref 78.0–100.0)
Platelets: 216 10*3/uL (ref 150–400)
RBC: 4.54 MIL/uL (ref 4.22–5.81)
RDW: 14.1 % (ref 11.5–15.5)
WBC: 7.8 10*3/uL (ref 4.0–10.5)

## 2014-12-23 LAB — BASIC METABOLIC PANEL
ANION GAP: 9 (ref 5–15)
BUN: 12 mg/dL (ref 6–20)
CALCIUM: 9.2 mg/dL (ref 8.9–10.3)
CHLORIDE: 100 mmol/L — AB (ref 101–111)
CO2: 27 mmol/L (ref 22–32)
CREATININE: 0.9 mg/dL (ref 0.61–1.24)
GFR calc non Af Amer: >60 mL/min (ref >60)
Glucose, Bld: 118 mg/dL — ABNORMAL HIGH (ref 65–99)
Potassium: 3.1 mmol/L — ABNORMAL LOW (ref 3.5–5.1)
Sodium: 136 mmol/L (ref 135–145)

## 2014-12-23 LAB — I-STAT TROPONIN, ED: Troponin i, poc: 0 ng/mL (ref 0.00–0.08)

## 2014-12-23 MED ORDER — PANTOPRAZOLE SODIUM 40 MG PO TBEC
40.0000 mg | DELAYED_RELEASE_TABLET | Freq: Once | ORAL | Status: AC
  Administered 2014-12-23: 40 mg via ORAL
  Filled 2014-12-23: qty 1

## 2014-12-23 MED ORDER — NITROGLYCERIN 0.4 MG SL SUBL
0.4000 mg | SUBLINGUAL_TABLET | Freq: Once | SUBLINGUAL | Status: AC
  Administered 2014-12-23: 0.4 mg via SUBLINGUAL
  Filled 2014-12-23: qty 1

## 2014-12-23 MED ORDER — PANTOPRAZOLE SODIUM 40 MG PO TBEC: 40.0000 mg | DELAYED_RELEASE_TABLET | Freq: Every day | ORAL | Status: DC

## 2014-12-23 MED ORDER — GI COCKTAIL ~~LOC~~
30.0000 mL | Freq: Once | ORAL | Status: AC
  Administered 2014-12-23: 30 mL via ORAL
  Filled 2014-12-23: qty 30

## 2014-12-23 NOTE — Discharge Instructions (Signed)
Esophageal Spasm °Esophageal spasm is an uncoordinated contraction of the muscles of the esophagus (the tube which carries food from your mouth to your stomach). Normally, the muscles of the esophagus alternate between contraction and relaxation starting from the top of the esophagus and working down to the bottom. This moves the food from the mouth to the stomach. In esophageal spasm, all the muscles contract at once. This causes pain and fails to move the food along. As a result, you may have trouble swallowing.  °Women are more likely than men to have esophageal spasm. The cause of the spasms is not known. Sometimes eating hot or cold foods triggers the condition and this may be due to an overly sensitive esophagus. This is not an infectious disease and cannot be passed to others. °SYMPTOMS  °Symptoms of esophageal spasm may include: chest pain, burning or pain with swallowing, and difficulty swallowing.  °DIAGNOSIS  °Esophageal spasm can be diagnosed by a test called manometry (pressure studies of the esophagus). In this test, a special tube is inserted down the esophagus. The tube measures the muscle activity of the esophagus. Abnormal contractions mixed with normal movement helps confirm the diagnosis.  °A person with a hypersensitive esophagus may be diagnosed by inflating a long balloon in the person's esophagus. If this causes the same symptoms, preventive methods may work. °PREVENTION  °Avoid hot or cold foods if that seems to be a trigger. °PROGNOSIS  °This condition does not go away, nor is treatment entirely satisfactory. Patients need to be careful of what they eat. They need to continue on medication if a useful one is found. Fortunately, the condition does not get progressively worse as time passes. °Esophageal spasm does not usually lead to more serious problems but sometimes the pain can be disabling. If a person becomes afraid to eat they may become malnourished and lose weight.  °TREATMENT  °· A  procedure in which instruments of increasing size are inserted through the esophagus to enlarge (dilate) it are used. °· Medications that decrease acid-production of the stomach may be used such as proton-pump inhibitors or H2-blockers. °· Medications of several types can be used to relax the muscles of the esophagus. °· An individual with a hypersensitive esophagus sometimes improves with low doses of medications normally used for depression. °· No treatment for esophageal spasm is effective for everyone. Often several approaches will be tried before one works. In many cases, the symptoms will improve, but will not go away completely. °· For severe cases, relief is obtained two-thirds of the time by cutting the muscles along the entire length of the esophagus. This is a major surgical procedure. °· Your symptoms are usually the best guide to how well the treatment for esophageal spasm works. °SIDE EFFECTS OF TREATMENTS °· Nitrates can cause headaches and low blood pressure. °· Calcium channel blockers can cause: °¨ Feeling sick to your stomach (nausea). °¨ Constipation and other side effects. °· Antidepressants can cause side effects that depend on the medication used. °HOME CARE INSTRUCTIONS  °· Let your caregiver know if problems are getting worse, or if you get food stuck in your esophagus for longer than 1 hour or as directed and are unable to swallow liquid. °· Take medications as directed and with permission of your caregiver. Ask about what to do if a medication seems to get stuck in your esophagus. Only take over-the-counter or prescription medicines for pain, discomfort, or fever as directed by your caregiver. °· Soft and liquid foods   pass more easily than solid pieces. °SEEK IMMEDIATE MEDICAL CARE IF:  °· You develop severe chest pain, especially if the pain is crushing or pressure-like and spreads to the arms, back, neck, or jaw, or if you have sweating, nausea, or shortness of breath. THIS COULD BE AN  EMERGENCY. Do not wait to see if the pain will go away. Get medical help at once. Call 911 or 0 (operator). DO NOT drive yourself to the hospital. °· Your chest pain gets worse and does not go away with rest. °· You have an attack of chest pain lasting longer than usual despite rest and treatment with the medications your physician has prescribed. °· You wake from sleep with chest pain or shortness of breath. °· You feel dizzy or faint. °· You have chest pain, not typical of your usual pain, caused by your esophagus for which you originally saw your caregiver. °MAKE SURE YOU:  °· Understand these instructions. °· Will watch your condition. °· Will get help right away if you are not doing well or get worse. °Document Released: 10/09/2002 Document Revised: 10/11/2011 Document Reviewed: 10/12/2013 °ExitCare® Patient Information ©2015 [employer]. This information is not intended to replace advice given to you by your health care provider. Make sure you discuss any questions you have with your health care provider. ° °

## 2014-12-23 NOTE — ED Notes (Signed)
Pt states that chest tightness started around 2am while at work, c/o throat tightness also, denies SOB ,n/v.

## 2014-12-23 NOTE — ED Provider Notes (Signed)
TIME SEEN: 4:45 AM  CHIEF COMPLAINT: Chest pain, pain with swallowing  HPI: Pt is a 29 y.o. male with history of tobacco use who presents to the emergency department with complaints of tightness in his throat in pain whenever he is swallowing. States he feels something traveling down his chest when he is swallowing and feels like it is difficult to swallow but is able to do so. Symptoms started around 2 AM after he got home from work. He denies any shortness of breath, nausea or vomiting, diaphoresis or dizziness. No lower extremity swelling or pain. No fever or cough. Denies bloody stool or melena. Denies abdominal pain. Has never had problems with esophageal spasm, stricture, GERD in the past. No history of PE or DVT, recent prolonged immobilization such as long flight or hospitalization, fracture, surgery, trauma. No family history of premature CAD.  ROS: See HPI Constitutional: no fever  Eyes: no drainage  ENT: no runny nose   Cardiovascular:   chest pain  Resp: no SOB  GI: no vomiting GU: no dysuria Integumentary: no rash  Allergy: no hives  Musculoskeletal: no leg swelling  Neurological: no slurred speech ROS otherwise negative  PAST MEDICAL HISTORY/PAST SURGICAL HISTORY:  History reviewed. No pertinent past medical history.  MEDICATIONS:  Prior to Admission medications   Medication Sig Start Date End Date Taking? Authorizing Provider  ibuprofen (ADVIL,MOTRIN) 200 MG tablet Take 400 mg by mouth every 6 (six) hours as needed for mild pain.    Historical Provider, MD  naproxen (NAPROSYN) 500 MG tablet Take 1 tablet (500 mg total) by mouth 2 (two) times daily. Patient not taking: Reported on 11/25/2014 05/22/14   Antony MaduraKelly Humes, PA-C    ALLERGIES:  No Known Allergies  SOCIAL HISTORY:  History  Substance Use Topics  . Smoking status: Current Every Day Smoker  . Smokeless tobacco: Not on file  . Alcohol Use: Yes    FAMILY HISTORY: No family history on file.  EXAM: BP 124/80  mmHg  Pulse 69  Temp(Src) 98.2 F (36.8 C) (Oral)  Resp 16  Ht 5\' 9"  (1.753 m)  Wt 130 lb (58.968 kg)  BMI 19.19 kg/m2  SpO2 100% CONSTITUTIONAL: Alert and oriented and responds appropriately to questions. Well-appearing; well-nourished HEAD: Normocephalic EYES: Conjunctivae clear, PERRL ENT: normal nose; no rhinorrhea; moist mucous membranes; pharynx without lesions noted, no tonsillar hypertrophy or exudate, no uvular deviation, no trismus or drooling, normal phonation, able to swallow his secretions NECK: Supple, no meningismus, no LAD  CARD: RRR; S1 and S2 appreciated; no murmurs, no clicks, no rubs, no gallops RESP: Normal chest excursion without splinting or tachypnea; breath sounds clear and equal bilaterally; no wheezes, no rhonchi, no rales, no hypoxia or respiratory distress, speaking full sentences; anterior chest wall is nontender to palpation without crepitus or ecchymosis or deformity ABD/GI: Normal bowel sounds; non-distended; soft, non-tender, no rebound, no guarding, no peritoneal signs BACK:  The back appears normal and is non-tender to palpation, there is no CVA tenderness EXT: Normal ROM in all joints; non-tender to palpation; no edema; normal capillary refill; no cyanosis, no calf tenderness or swelling    SKIN: Normal color for age and race; warm NEURO: Moves all extremities equally, sensation to light touch intact diffusely, cranial nerves II through XII intact PSYCH: The patient's mood and manner are appropriate. Grooming and personal hygiene are appropriate.  MEDICAL DECISION MAKING: Patient here with likely esophageal spasm, esophagitis. We'll give GI cocktail, Protonix and nitroglycerin. His EKG shows no ischemic  changes, arrhythmia or interval change. We'll order chest x-ray as well.  ED PROGRESS: Labs ordered by nursing staff are unremarkable.  Troponin negative. Chest x-ray clear. Patient reports feeling much better. Will discharge home with prescription for  Protonix. We'll discharge with gastroenterology follow-up information for possible esophagitis. He is able to swallow without difficulty. Discussed return precautions. He verbalizes understanding and is comfortable with plan.    EKG Interpretation  Date/Time:  Monday Dec 23 2014 04:40:52 EDT Ventricular Rate:  64 PR Interval:  193 QRS Duration: 96 QT Interval:  391 QTC Calculation: 403 R Axis:   86 Text Interpretation:  Sinus rhythm No significant change since last tracing Confirmed by Nelwyn Hebdon,  DO, Acie Custis [phone]) on 12/23/2014 5:04:49 AM        Layla Maw Malli Falotico, DO 12/23/14 [id number]

## 2015-04-15 ENCOUNTER — Emergency Department (HOSPITAL_COMMUNITY)
Admission: EM | Admit: 2015-04-15 | Discharge: 2015-04-15 | Disposition: A | Discharge status: Home / Self Care | Payer: Managed Care, Other (non HMO) | Attending: Emergency Medicine | Admitting: Emergency Medicine

## 2015-04-15 ENCOUNTER — Encounter (HOSPITAL_COMMUNITY): Payer: Self-pay | Admitting: Emergency Medicine

## 2015-04-15 DIAGNOSIS — Z720 Tobacco use: Secondary | ICD-10-CM | POA: Insufficient documentation

## 2015-04-15 DIAGNOSIS — R300 Dysuria: Secondary | ICD-10-CM | POA: Insufficient documentation

## 2015-04-15 LAB — URINALYSIS, ROUTINE W REFLEX MICROSCOPIC
Bilirubin Urine: NEGATIVE
Glucose, UA: NEGATIVE mg/dL
Hgb urine dipstick: NEGATIVE
Ketones, ur: NEGATIVE mg/dL
LEUKOCYTES UA: NEGATIVE
Nitrite: NEGATIVE
PH: 6 (ref 5.0–8.0)
Protein, ur: NEGATIVE mg/dL
Specific Gravity, Urine: 1.022 (ref 1.005–1.030)
Urobilinogen, UA: 0.2 mg/dL (ref 0.0–1.0)

## 2015-04-15 LAB — GC/CHLAMYDIA PROBE AMP (~~LOC~~) NOT AT ARMC
Chlamydia: NEGATIVE
Neisseria Gonorrhea: NEGATIVE

## 2015-04-15 MED ORDER — CEFTRIAXONE SODIUM 250 MG IJ SOLR
250.0000 mg | Freq: Once | INTRAMUSCULAR | Status: AC
  Administered 2015-04-15: 250 mg via INTRAMUSCULAR
  Filled 2015-04-15: qty 250

## 2015-04-15 MED ORDER — AZITHROMYCIN 250 MG PO TABS
1000.0000 mg | ORAL_TABLET | Freq: Once | ORAL | Status: AC
  Administered 2015-04-15: 1000 mg via ORAL
  Filled 2015-04-15: qty 4

## 2015-04-15 MED ORDER — LIDOCAINE HCL (PF) 1 % IJ SOLN
2.0000 mL | Freq: Once | INTRAMUSCULAR | Status: AC
  Administered 2015-04-15: 2 mL

## 2015-04-15 NOTE — ED Notes (Signed)
Pt. reports " tingling" sensation when urinating onset 2 days ago , denies fever , pt. added itchy rashes at testicles and thighs.

## 2015-04-15 NOTE — ED Provider Notes (Signed)
CSN: [id number]     Arrival date & time 04/15/15  0226 History   This chart was scribed for Tomasita Crumble, MD by Evon Slack, ED Scribe. This patient was seen in room A11C/A11C and the patient's care was started at 3:23 AM.     Chief Complaint  Patient presents with  . Dysuria   Patient is a 29 y.o. male presenting with dysuria. The history is provided by the patient. No language interpreter was used.  Dysuria This is a new problem. The current episode started 2 days ago. The problem occurs rarely. The problem has not changed since onset.Pertinent negatives include no abdominal pain. Nothing aggravates the symptoms. Nothing relieves the symptoms. He has tried nothing for the symptoms.   HPI Comments: [name] is a 29 y.o. male who presents to the Emergency Department complaining of new dysuria onset 2 days prior. Pt describes the dysuria as tingling. Pt denies penile discharge, abdominal pain, vomiting or rash. Pt does report un protected sex with a new partner 1 week prior.   History reviewed. No pertinent past medical history. Past Surgical History  Procedure Laterality Date  . Appendectomy     No family history on file. Social History  Substance Use Topics  . Smoking status: Current Every Day Smoker  . Smokeless tobacco: None  . Alcohol Use: Yes    Review of Systems  Gastrointestinal: Negative for abdominal pain.  Genitourinary: Positive for dysuria. Negative for discharge and penile pain.  Skin: Negative for rash.  All other systems reviewed and are negative.    Allergies  Review of patient's allergies indicates no known allergies.  Home Medications   Prior to Admission medications   Medication Sig Start Date End Date Taking? Authorizing Provider  naproxen (NAPROSYN) 500 MG tablet Take 1 tablet (500 mg total) by mouth 2 (two) times daily. Patient not taking: Reported on 11/25/2014 05/22/14   Antony Madura, PA-C  pantoprazole (PROTONIX) 40 MG tablet Take 1 tablet  (40 mg total) by mouth daily. Patient not taking: Reported on 04/15/2015 12/23/14   Kristen N Ward, DO   BP 125/84 mmHg  Pulse 67  Temp(Src) 97.8 F (36.6 C) (Oral)  Resp 14  SpO2 100%   Physical Exam  Constitutional: He is oriented to person, place, and time. Vital signs are normal. He appears well-developed and well-nourished.  Non-toxic appearance. He does not appear ill. No distress.  HENT:  Head: Normocephalic and atraumatic.  Nose: Nose normal.  Mouth/Throat: Oropharynx is clear and moist. No oropharyngeal exudate.  Eyes: Conjunctivae and EOM are normal. Pupils are equal, round, and reactive to light. No scleral icterus.  Neck: Normal range of motion. Neck supple. No tracheal deviation, no edema, no erythema and normal range of motion present. No thyroid mass and no thyromegaly present.  Cardiovascular: Normal rate, regular rhythm, S1 normal, S2 normal, normal heart sounds, intact distal pulses and normal pulses.  Exam reveals no gallop and no friction rub.   No murmur heard. Pulses:      Radial pulses are 2+ on the right side, and 2+ on the left side.       Dorsalis pedis pulses are 2+ on the right side, and 2+ on the left side.  Pulmonary/Chest: Effort normal and breath sounds normal. No respiratory distress. He has no wheezes. He has no rhonchi. He has no rales.  Abdominal: Soft. Normal appearance and bowel sounds are normal. He exhibits no distension, no ascites and no mass. There is no  hepatosplenomegaly. There is no tenderness. There is no rebound, no guarding and no CVA tenderness.  Genitourinary: Penis normal. No penile tenderness.  No swelling or rashes seen. No tenderness in the GU exam.  Musculoskeletal: Normal range of motion. He exhibits no edema or tenderness.  Lymphadenopathy:    He has no cervical adenopathy.  Neurological: He is alert and oriented to person, place, and time. He has normal strength. No cranial nerve deficit or sensory deficit.  Skin: Skin is warm,  dry and intact. No petechiae and no rash noted. He is not diaphoretic. No erythema. No pallor.  Psychiatric: He has a normal mood and affect. His behavior is normal. Judgment normal.  Nursing note and vitals reviewed.   ED Course  Procedures (including critical care time) DIAGNOSTIC STUDIES: Oxygen Saturation is 100% on RA, normal by my interpretation.    COORDINATION OF CARE: 3:26 AM-Discussed treatment plan with pt at bedside and pt agreed to plan.     Labs Review Labs Reviewed  URINALYSIS, ROUTINE W REFLEX MICROSCOPIC (NOT AT [facility])    Imaging Review No results found.    EKG Interpretation None      MDM   Final diagnoses:  None    Patient presents emergency department for dysuria for the last 2 days. He denies any rash to me and his legs despite the triage notes. On my evaluation I did not see a rash either. Urinalysis is negative for infection, it was sent for gonorrhea and chlamydia. He was offered STD treatment and this was given to him. Primary care follow-up was advised. He appears well in no acute distress, his vital signs were within his normal limits and he is safe for discharge.   I personally performed the services described in this documentation, which was scribed in my presence. The recorded information has been reviewed and is accurate.    Tomasita Crumble, MD 04/15/15 [phone]

## 2015-04-15 NOTE — Discharge Instructions (Signed)
Dysuria  Mr. [name], your urine test today is normal. Your symptoms may be due to a sexual transmitted disease. You were treated for this. See primary care physician within 3 days for close follow-up. If symptoms worsen come back to emergency department immediately. Thank you.  Dysuria is the medical term for pain with urination. There are many causes for dysuria, but urinary tract infection is the most common. If a urinalysis was performed it can show that there is a urinary tract infection. A urine culture confirms that you or your child is sick. You will need to follow up with a healthcare provider because:  If a urine culture was done you will need to know the culture results and treatment recommendations.  If the urine culture was positive, you or your child will need to be put on antibiotics or know if the antibiotics prescribed are the right antibiotics for your urinary tract infection.  If the urine culture is negative (no urinary tract infection), then other causes may need to be explored or antibiotics need to be stopped. Today laboratory work may have been done and there does not seem to be an infection. If cultures were done they will take at least 24 to 48 hours to be completed. Today x-rays may have been taken and they read as normal. No cause can be found for the problems. The x-rays may be re-read by a radiologist and you will be contacted if additional findings are made. You or your child may have been put on medications to help with this problem until you can see your primary caregiver. If the problems get better, see your primary caregiver if the problems return. If you were given antibiotics (medications which kill germs), take all of the mediations as directed for the full course of treatment.  If laboratory work was done, you need to find the results. Leave a telephone number where you can be reached. If this is not possible, make sure you find out how you are to get test  results. HOME CARE INSTRUCTIONS   Drink lots of fluids. For adults, drink eight, 8 ounce glasses of clear juice or water a day. For children, replace fluids as suggested by your caregiver.  Empty the bladder often. Avoid holding urine for long periods of time.  After a bowel movement, women should cleanse front to back, using each tissue only once.  Empty your bladder before and after sexual intercourse.  Take all the medicine given to you until it is gone. You may feel better in a few days, but TAKE ALL MEDICINE.  Avoid caffeine, tea, alcohol and carbonated beverages, because they tend to irritate the bladder.  In men, alcohol may irritate the prostate.  Only take over-the-counter or prescription medicines for pain, discomfort, or fever as directed by your caregiver.  If your caregiver has given you a follow-up appointment, it is very important to keep that appointment. Not keeping the appointment could result in a chronic or permanent injury, pain, and disability. If there is any problem keeping the appointment, you must call back to this facility for assistance. SEEK IMMEDIATE MEDICAL CARE IF:   Back pain develops.  A fever develops.  There is nausea (feeling sick to your stomach) or vomiting (throwing up).  Problems are no better with medications or are getting worse. MAKE SURE YOU:   Understand these instructions.  Will watch your condition.  Will get help right away if you are not doing well or get worse. Document Released:  04/16/2004 Document Revised: 10/11/2011 Document Reviewed: 02/22/2008 ExitCare Patient Information 2015 [place], [place]. This information is not intended to replace advice given to you by your health care provider. Make sure you discuss any questions you have with your health care provider.

## 2015-04-30 ENCOUNTER — Encounter (HOSPITAL_COMMUNITY): Payer: Self-pay | Admitting: *Deleted

## 2015-04-30 ENCOUNTER — Emergency Department (INDEPENDENT_AMBULATORY_CARE_PROVIDER_SITE_OTHER)
Admission: EM | Admit: 2015-04-30 | Discharge: 2015-04-30 | Disposition: A | Discharge status: Home / Self Care | Payer: Managed Care, Other (non HMO) | Source: Home / Self Care | Attending: Family Medicine | Admitting: Family Medicine

## 2015-04-30 DIAGNOSIS — F458 Other somatoform disorders: Secondary | ICD-10-CM | POA: Diagnosis not present

## 2015-04-30 LAB — POCT URINALYSIS DIP (DEVICE)
Bilirubin Urine: NEGATIVE
GLUCOSE, UA: NEGATIVE mg/dL
Hgb urine dipstick: NEGATIVE
Ketones, ur: NEGATIVE mg/dL
LEUKOCYTES UA: NEGATIVE
Nitrite: NEGATIVE
Protein, ur: NEGATIVE mg/dL
SPECIFIC GRAVITY, URINE: 1.015 (ref 1.005–1.030)
Urobilinogen, UA: 0.2 mg/dL (ref 0.0–1.0)
pH: 7 (ref 5.0–8.0)

## 2015-04-30 NOTE — ED Notes (Addendum)
Pt  Seen   Er   2   Weeks  Ago  Was   Treated  For  Presumed        Std was given  meds   Still  Reports      Burning  When  Urinates

## 2015-04-30 NOTE — ED Provider Notes (Signed)
CSN: [id number]     Arrival date & time 04/30/15  1307 History   First MD Initiated Contact with Patient 04/30/15 1413     Chief Complaint  Patient presents with  . Follow-up   (Consider location/radiation/quality/duration/timing/severity/associated sxs/prior Treatment) Patient is a 29 y.o. male presenting with dysuria. The history is provided by the patient.  Dysuria This is a new problem. The current episode started more than 1 week ago. The problem has not changed since onset.Pertinent negatives include no abdominal pain.    History reviewed. No pertinent past medical history. Past Surgical History  Procedure Laterality Date  . Appendectomy     History reviewed. No pertinent family history. Social History  Substance Use Topics  . Smoking status: Current Every Day Smoker  . Smokeless tobacco: None  . Alcohol Use: Yes    Review of Systems  Gastrointestinal: Negative for abdominal pain.  Genitourinary: Positive for dysuria. Negative for urgency, hematuria, discharge, penile swelling, scrotal swelling, genital sores, penile pain and testicular pain.  All other systems reviewed and are negative.   Allergies  Review of patient's allergies indicates no known allergies.  Home Medications   Prior to Admission medications   Medication Sig Start Date End Date Taking? Authorizing Provider  naproxen (NAPROSYN) 500 MG tablet Take 1 tablet (500 mg total) by mouth 2 (two) times daily. Patient not taking: Reported on 11/25/2014 05/22/14   Antony Madura, PA-C  pantoprazole (PROTONIX) 40 MG tablet Take 1 tablet (40 mg total) by mouth daily. Patient not taking: Reported on 04/15/2015 12/23/14   Layla Maw Ward, DO   Meds Ordered and Administered this Visit  Medications - No data to display  BP 123/78 mmHg  Pulse 62  Temp(Src) 97.9 F (36.6 C) (Oral)  Resp 16  SpO2 100% No data found.   Physical Exam  Constitutional: He is oriented to person, place, and time. He appears  well-developed and well-nourished.  Genitourinary: Penis normal. No penile tenderness.  Nl penile exam, no lesions.no adenopathy.  Neurological: He is alert and oriented to person, place, and time.  Skin: Skin is warm and dry.  Nursing note and vitals reviewed.   ED Course  Procedures (including critical care time)  Labs Review Labs Reviewed  POCT URINALYSIS DIP (DEVICE)   U/a neg. Imaging Review No results found.   Visual Acuity Review  Right Eye Distance:   Left Eye Distance:   Bilateral Distance:    Right Eye Near:   Left Eye Near:    Bilateral Near:         MDM   1. Psychogenic dysuria        Linna Hoff, MD 04/30/15 [phone]

## 2015-04-30 NOTE — Discharge Instructions (Signed)
Drink plenty of water, avoid caffeine, see urologist if further problems.

## 2015-07-15 ENCOUNTER — Emergency Department (INDEPENDENT_AMBULATORY_CARE_PROVIDER_SITE_OTHER)
Admission: EM | Admit: 2015-07-15 | Discharge: 2015-07-15 | Disposition: A | Discharge status: Home / Self Care | Payer: Managed Care, Other (non HMO) | Source: Home / Self Care | Attending: Emergency Medicine | Admitting: Emergency Medicine

## 2015-07-15 ENCOUNTER — Encounter (HOSPITAL_COMMUNITY): Payer: Self-pay | Admitting: *Deleted

## 2015-07-15 ENCOUNTER — Emergency Department (INDEPENDENT_AMBULATORY_CARE_PROVIDER_SITE_OTHER): Payer: Managed Care, Other (non HMO)

## 2015-07-15 DIAGNOSIS — S62639A Displaced fracture of distal phalanx of unspecified finger, initial encounter for closed fracture: Secondary | ICD-10-CM

## 2015-07-15 NOTE — Discharge Instructions (Signed)
Finger Fracture °Finger fractures are breaks in the bones of the fingers. There are many types of fractures. There are also different ways of treating these fractures. Your doctor will talk with you about the best way to treat your fracture. °Injury is the main cause of broken fingers. This includes: °· Injuries while playing sports. °· Workplace injuries. °· Falls. °HOME CARE °· Follow your doctor's instructions for: °¨ Activities. °¨ Exercises. °¨ Physical therapy. °· Take medicines only as told by your doctor for pain, discomfort, or fever. °GET HELP IF: °You have pain or swelling that limits: °· The motion of your fingers. °· The use of your fingers. °GET HELP RIGHT AWAY IF: °· You cannot feel your fingers, or your fingers become numb. °  °This information is not intended to replace advice given to you by your health care provider. Make sure you discuss any questions you have with your health care provider. °  °Document Released: 01/05/2008 Document Revised: 08/09/2014 Document Reviewed: 02/28/2013 °Elsevier Interactive Patient Education ©2016 [employer] ° °

## 2015-07-15 NOTE — ED Notes (Signed)
Pt  Reports  He  Slammed  The  l index  Finger       In a  Car  Door       He  Has      Pain      And  Swelling  Of       The  Affected       Finger

## 2015-07-15 NOTE — ED Provider Notes (Signed)
CSN: [id number]     Arrival date & time 07/15/15  1301 History   First MD Initiated Contact with Patient 07/15/15 1317     Chief Complaint  Patient presents with  . Finger Injury   (Consider location/radiation/quality/duration/timing/severity/associated sxs/prior Treatment) HPI History obtained from patient:   LOCATION: left ring finger SEVERITY: 6 DURATION: last night CONTEXT:shut in car door QUALITY: MODIFYING FACTORS:none ASSOCIATED SYMPTOMS:swelling pain, TIMING:constant OCCUPATION: [employer]  No past medical history on file. Past Surgical History  Procedure Laterality Date  . Appendectomy     No family history on file. Social History  Substance Use Topics  . Smoking status: Current Every Day Smoker  . Smokeless tobacco: Not on file  . Alcohol Use: Yes    Review of Systems ROS +'ve finger injury  Denies: HEADACHE, NAUSEA, ABDOMINAL PAIN, CHEST PAIN, CONGESTION, DYSURIA, SHORTNESS OF BREATH  Allergies  Review of patient's allergies indicates no known allergies.  Home Medications   Prior to Admission medications   Medication Sig Start Date End Date Taking? Authorizing Provider  naproxen (NAPROSYN) 500 MG tablet Take 1 tablet (500 mg total) by mouth 2 (two) times daily. Patient not taking: Reported on 11/25/2014 05/22/14   Antony MaduraKelly Humes, PA-C  pantoprazole (PROTONIX) 40 MG tablet Take 1 tablet (40 mg total) by mouth daily. Patient not taking: Reported on 04/15/2015 12/23/14   Layla MawKristen N Ward, DO   Meds Ordered and Administered this Visit  Medications - No data to display  BP 128/74 mmHg  Pulse 72  Temp(Src) 98.1 F (36.7 C) (Oral)  Resp 18  SpO2 100% No data found.   Physical Exam  Constitutional: He is oriented to person, place, and time. He appears well-developed and well-nourished.  Musculoskeletal:       Left hand: He exhibits tenderness.       Hands: Neurological: He is alert and oriented to person, place, and time.  Skin: Skin is warm and  dry.  Psychiatric: He has a normal mood and affect. His behavior is normal. Judgment and thought content normal.  Nursing note and vitals reviewed.   ED Course  Procedures (including critical care time)  Labs Review Labs Reviewed - No data to display  Imaging Review Dg Finger Ring Left  07/15/2015  CLINICAL DATA:  The patient shut her left ring finger in a car door 07/14/2015. Pain. Initial encounter. EXAM: LEFT RING FINGER 2+V COMPARISON:  None. FINDINGS: There is a nondisplaced fracture through the proximal metaphysis of the distal phalanx of the left ring finger. No other bony or joint abnormality is identified. IMPRESSION: Nondisplaced fracture proximal metaphysis of the distal phalanx left ring finger. Electronically Signed   By: Drusilla Kannerhomas  Dalessio M.D.   On: 07/15/2015 14:09     Visual Acuity Review  Right Eye Distance:   Left Eye Distance:   Bilateral Distance:    Right Eye Near:   Left Eye Near:    Bilateral Near:         MDM   1. Distal phalanx or phalanges, closed fracture, initial encounter    Independent review of left ring finger x-ray is positive for fracture phalanx.  Splint to be applied (nursing staff). Follow-up with his specialist.  Light duty work note is provided instructions or care provided discharged home in stable condition.    Tharon AquasFrank C Patrick, PA 07/15/15 [phone]

## 2015-09-04 ENCOUNTER — Emergency Department (HOSPITAL_COMMUNITY)
Admission: EM | Admit: 2015-09-04 | Discharge: 2015-09-04 | Disposition: A | Discharge status: Home / Self Care | Payer: Managed Care, Other (non HMO) | Attending: Emergency Medicine | Admitting: Emergency Medicine

## 2015-09-04 ENCOUNTER — Encounter (HOSPITAL_COMMUNITY): Payer: Self-pay | Admitting: *Deleted

## 2015-09-04 DIAGNOSIS — L0231 Cutaneous abscess of buttock: Secondary | ICD-10-CM | POA: Diagnosis not present

## 2015-09-04 DIAGNOSIS — K625 Hemorrhage of anus and rectum: Secondary | ICD-10-CM | POA: Diagnosis not present

## 2015-09-04 DIAGNOSIS — F17200 Nicotine dependence, unspecified, uncomplicated: Secondary | ICD-10-CM | POA: Insufficient documentation

## 2015-09-04 DIAGNOSIS — R42 Dizziness and giddiness: Secondary | ICD-10-CM | POA: Insufficient documentation

## 2015-09-04 LAB — BASIC METABOLIC PANEL
ANION GAP: 11 (ref 5–15)
BUN: 11 mg/dL (ref 6–20)
CO2: 27 mmol/L (ref 22–32)
Calcium: 9.7 mg/dL (ref 8.9–10.3)
Chloride: 100 mmol/L — ABNORMAL LOW (ref 101–111)
Creatinine, Ser: 0.78 mg/dL (ref 0.61–1.24)
GFR calc Af Amer: >60 mL/min (ref >60)
GFR calc non Af Amer: >60 mL/min (ref >60)
Glucose, Bld: 97 mg/dL (ref 65–99)
POTASSIUM: 3.7 mmol/L (ref 3.5–5.1)
Sodium: 138 mmol/L (ref 135–145)

## 2015-09-04 LAB — CBC
HEMATOCRIT: 41.4 % (ref 39.0–52.0)
HEMOGLOBIN: 14.1 g/dL (ref 13.0–17.0)
MCH: 30.7 pg (ref 26.0–34.0)
MCHC: 34.1 g/dL (ref 30.0–36.0)
MCV: 90.2 fL (ref 78.0–100.0)
Platelets: 232 10*3/uL (ref 150–400)
RBC: 4.59 MIL/uL (ref 4.22–5.81)
RDW: 13.7 % (ref 11.5–15.5)
WBC: 6.1 10*3/uL (ref 4.0–10.5)

## 2015-09-04 LAB — URINALYSIS, ROUTINE W REFLEX MICROSCOPIC
BILIRUBIN URINE: NEGATIVE
GLUCOSE, UA: NEGATIVE mg/dL
HGB URINE DIPSTICK: NEGATIVE
Ketones, ur: NEGATIVE mg/dL
Leukocytes, UA: NEGATIVE
Nitrite: NEGATIVE
Protein, ur: NEGATIVE mg/dL
SPECIFIC GRAVITY, URINE: 1.008 (ref 1.005–1.030)
pH: 6 (ref 5.0–8.0)

## 2015-09-04 LAB — CBG MONITORING, ED: Glucose-Capillary: 80 mg/dL (ref 65–99)

## 2015-09-04 MED ORDER — SULFAMETHOXAZOLE-TRIMETHOPRIM 800-160 MG PO TABS
1.0000 | ORAL_TABLET | Freq: Once | ORAL | Status: AC
  Administered 2015-09-04: 1 via ORAL
  Filled 2015-09-04: qty 1

## 2015-09-04 MED ORDER — SULFAMETHOXAZOLE-TRIMETHOPRIM 800-160 MG PO TABS: 1.0000 | ORAL_TABLET | Freq: Two times a day (BID) | ORAL | Status: AC

## 2015-09-04 MED ORDER — DOCUSATE SODIUM 100 MG PO CAPS: 100.0000 mg | ORAL_CAPSULE | Freq: Two times a day (BID) | ORAL | Status: DC

## 2015-09-04 NOTE — ED Notes (Signed)
Pt states he has felt dizzy all day today. States blood on the toliet paper after he has a bowel movement for three days. All states he has a "buldge" on his left buttocks that is not painful.

## 2015-09-04 NOTE — ED Provider Notes (Signed)
TIME SEEN: 4:30 AM  CHIEF COMPLAINT: dizziness, rectal bleeding, "bump on my butt"  HPI: Pt is a 30 y.o. male with no significant past medical history who presents to the emergency department with multiple complaints. Reports that today he was feeling lightheaded. States this is now resolved. Denies any chest pain, shortness of breath, numbness, tingling or focal weakness. No recent vomiting or diarrhea.   Also reports that he has had rectal bleeding for the past several weeks intermittent. States he does see blood on the toilet after bowel movement. Denies being constipated. Denies melena. No abdominal pain. No history of anticoagulation use. No history of colonoscopy. Has never had rectal bleeding before. Denies rectal pain. Does state that he has noticed a "bump" on his buttock. No drainage from this area.  ROS: See HPI Constitutional: no fever  Eyes: no drainage  ENT: no runny nose   Cardiovascular:  no chest pain  Resp: no SOB  GI: no vomiting GU: no dysuria Integumentary: no rash  Allergy: no hives  Musculoskeletal: no leg swelling  Neurological: no slurred speech ROS otherwise negative  PAST MEDICAL HISTORY/PAST SURGICAL HISTORY:  History reviewed. No pertinent past medical history.  MEDICATIONS:  Prior to Admission medications   Medication Sig Start Date End Date Taking? Authorizing Provider  ibuprofen (ADVIL,MOTRIN) 200 MG tablet Take 400 mg by mouth every 6 (six) hours as needed for headache or moderate pain.   Yes Historical Provider, MD    ALLERGIES:  No Known Allergies  SOCIAL HISTORY:  Social History  Substance Use Topics  . Smoking status: Current Every Day Smoker  . Smokeless tobacco: Not on file  . Alcohol Use: Yes    FAMILY HISTORY: No family history on file.  EXAM: BP 120/83 mmHg  Pulse 70  Temp(Src) 98.8 F (37.1 C) (Oral)  Resp 16  Ht  (1.753 m)  Wt 135 lb (61.236 kg)  BMI 19.93 kg/m2  SpO2 100% CONSTITUTIONAL: Alert and oriented and  responds appropriately to questions. Well-appearing; well-nourished HEAD: Normocephalic EYES: Conjunctivae clear, PERRL ENT: normal nose; no rhinorrhea; moist mucous membranes; pharynx without lesions noted NECK: Supple, no meningismus, no LAD  CARD: RRR; S1 and S2 appreciated; no murmurs, no clicks, no rubs, no gallops RESP: Normal chest excursion without splinting or tachypnea; breath sounds clear and equal bilaterally; no wheezes, no rhonchi, no rales, no hypoxia or respiratory distress, speaking full sentences ABD/GI: Normal bowel sounds; non-distended; soft, non-tender, no rebound, no guarding, no peritoneal signs RECTAL:  No gross blood or melena, no fecal impaction, normal rectal tone, patient has a 3-4 mm slightly raised and indurated nodule without fluctuance or surrounding erythema or warmth to the left buttock near the anus but this does not involve the rectum and there is no drainage, no subcutaneous air or crepitus BACK:  The back appears normal and is non-tender to palpation, there is no CVA tenderness EXT: Normal ROM in all joints; non-tender to palpation; no edema; normal capillary refill; no cyanosis, no calf tenderness or swelling    SKIN: Normal color for age and race; warm NEURO: Moves all extremities equally, sensation to light touch intact diffusely, cranial nerves II through XII intact, normal gait PSYCH: The patient's mood and manner are appropriate. Grooming and personal hygiene are appropriate.  MEDICAL DECISION MAKING: Patient here with multiple complaints. He has had lightheadedness that has now resolved. He has normal vital signs. Labs unremarkable with normal hemoglobin, electrolytes. EKG shows no significant abdomen compared to prior. No chest  pain or shortness of breath. He does have what appears to be a possible small abscess to the buttock but is so small that I do not feel it needs to be incised at this time. There is no fluctuance on exam. No surrounding cellulitis.  We'll place on Bactrim and have him apply warm compresses, soaks several times a day. Discussed with him return precautions. Discussed with patient that is below his rectal bleeding is likely from hemorrhoids. Have advised to increase his water and fiber intake, take Colace, follow-up with gastroenterology as needed if symptoms continue. Abdominal exam is benign. There is no gross blood or melena on exam currently. He is only describing blood when he has a bowel movement on the toilet paper. There is no sign of any life-threatening illness present. I feel he is safe to be discharged home. Discussed return precautions. Provide with outpatient follow-up. He verbalizes understanding and is comfortable with this plan. Apologized for patient's wait in the emergency department for multiple other high acuity patients in the emergency department to be seen prior to this patient.      EKG Interpretation  Date/Time:  Thursday September 04 2015 00:10:17 EST Ventricular Rate:  67 PR Interval:  200 QRS Duration: 92 QT Interval:  396 QTC Calculation: 418 R Axis:   98 Text Interpretation:  Normal sinus rhythm Rightward axis Nonspecific T wave abnormality Abnormal ECG No significant change since last tracing Confirmed by Joshu Furukawa,  DO, Isair Inabinet [phone]) on 09/04/2015 4:33:25 AM          Layla Maw Tamy Accardo, DO 09/04/15 6045

## 2015-09-04 NOTE — ED Notes (Signed)
Patient states tonight he was feeling a little dizzy.  Denies falling or any injury.  Also stated he noticed some blood (bright red) when he would use the bathroom.  Also stated he has a "bump" on his buttock.  Instructed him to remove his pants so we could check things out.  Stated "When is the doctor coming in to see me".  Explained that she would be in as soon as she could that she has other patients to see.  Stated he would wait to take his pants off until the doctor came into see him.

## 2015-09-04 NOTE — ED Notes (Signed)
CBG was 80

## 2015-09-04 NOTE — ED Notes (Signed)
Patient ready to leave AMA. Reports he wants to leave because he needs to give someone a ride and is tired of waiting. MD is involved with patient care at this time

## 2015-09-04 NOTE — Discharge Instructions (Signed)
Abscess An abscess is an infected area that contains a collection of pus and debris.It can occur in almost any part of the body. An abscess is also known as a furuncle or boil. CAUSES  An abscess occurs when tissue gets infected. This can occur from blockage of oil or sweat glands, infection of hair follicles, or a minor injury to the skin. As the body tries to fight the infection, pus collects in the area and creates pressure under the skin. This pressure causes pain. People with weakened immune systems have difficulty fighting infections and get certain abscesses more often.  SYMPTOMS Usually an abscess develops on the skin and becomes a painful mass that is red, warm, and tender. If the abscess forms under the skin, you may feel a moveable soft area under the skin. Some abscesses break open (rupture) on their own, but most will continue to get worse without care. The infection can spread deeper into the body and eventually into the bloodstream, causing you to feel ill.  DIAGNOSIS  Your caregiver will take your medical history and perform a physical exam. A sample of fluid may also be taken from the abscess to determine what is causing your infection. TREATMENT  Your caregiver may prescribe antibiotic medicines to fight the infection. However, taking antibiotics alone usually does not cure an abscess. Your caregiver may need to make a small cut (incision) in the abscess to drain the pus. In some cases, gauze is packed into the abscess to reduce pain and to continue draining the area. HOME CARE INSTRUCTIONS   Only take over-the-counter or prescription medicines for pain, discomfort, or fever as directed by your caregiver.  If you were prescribed antibiotics, take them as directed. Finish them even if you start to feel better.  If gauze is used, follow your caregiver's directions for changing the gauze.  To avoid spreading the infection:  Keep your draining abscess covered with a  bandage.  Wash your hands well.  Do not share personal care items, towels, or whirlpools with others.  Avoid skin contact with others.  Keep your skin and clothes clean around the abscess.  Keep all follow-up appointments as directed by your caregiver. SEEK MEDICAL CARE IF:   You have increased pain, swelling, redness, fluid drainage, or bleeding.  You have muscle aches, chills, or a general ill feeling.  You have a fever. MAKE SURE YOU:   Understand these instructions.  Will watch your condition.  Will get help right away if you are not doing well or get worse.   This information is not intended to replace advice given to you by your health care provider. Make sure you discuss any questions you have with your health care provider.   Document Released: 04/28/2005 Document Revised: 01/18/2012 Document Reviewed: 10/01/2011 Elsevier Interactive Patient Education 2016 [employer].   Hemorrhoids Hemorrhoids are swollen veins around the rectum or anus. There are two types of hemorrhoids:   Internal hemorrhoids. These occur in the veins just inside the rectum. They may poke through to the outside and become irritated and painful.  External hemorrhoids. These occur in the veins outside the anus and can be felt as a painful swelling or hard lump near the anus. CAUSES  Pregnancy.   Obesity.   Constipation or diarrhea.   Straining to have a bowel movement.   Sitting for long periods on the toilet.  Heavy lifting or other activity that caused you to strain.  Anal intercourse. SYMPTOMS   Pain.  Anal itching or irritation.   Rectal bleeding.   Fecal leakage.   Anal swelling.   One or more lumps around the anus.  DIAGNOSIS  Your caregiver may be able to diagnose hemorrhoids by visual examination. Other examinations or tests that may be performed include:   Examination of the rectal area with a gloved hand (digital rectal exam).   Examination of  anal canal using a small tube (scope).   A blood test if you have lost a significant amount of blood.  A test to look inside the colon (sigmoidoscopy or colonoscopy). TREATMENT Most hemorrhoids can be treated at home. However, if symptoms do not seem to be getting better or if you have a lot of rectal bleeding, your caregiver may perform a procedure to help make the hemorrhoids get smaller or remove them completely. Possible treatments include:   Placing a rubber band at the base of the hemorrhoid to cut off the circulation (rubber band ligation).   Injecting a chemical to shrink the hemorrhoid (sclerotherapy).   Using a tool to burn the hemorrhoid (infrared light therapy).   Surgically removing the hemorrhoid (hemorrhoidectomy).   Stapling the hemorrhoid to block blood flow to the tissue (hemorrhoid stapling).  HOME CARE INSTRUCTIONS   Eat foods with fiber, such as whole grains, beans, nuts, fruits, and vegetables. Ask your doctor about taking products with added fiber in them (fibersupplements).  Increase fluid intake. Drink enough water and fluids to keep your urine clear or pale yellow.   Exercise regularly.   Go to the bathroom when you have the urge to have a bowel movement. Do not wait.   Avoid straining to have bowel movements.   Keep the anal area dry and clean. Use wet toilet paper or moist towelettes after a bowel movement.   Medicated creams and suppositories may be used or applied as directed.   Only take over-the-counter or prescription medicines as directed by your caregiver.   Take warm sitz baths for 15-20 minutes, 3-4 times a day to ease pain and discomfort.   Place ice packs on the hemorrhoids if they are tender and swollen. Using ice packs between sitz baths may be helpful.   Put ice in a plastic bag.   Place a towel between your skin and the bag.   Leave the ice on for 15-20 minutes, 3-4 times a day.   Do not use a donut-shaped  pillow or sit on the toilet for long periods. This increases blood pooling and pain.  SEEK MEDICAL CARE IF:  You have increasing pain and swelling that is not controlled by treatment or medicine.  You have uncontrolled bleeding.  You have difficulty or you are unable to have a bowel movement.  You have pain or inflammation outside the area of the hemorrhoids. MAKE SURE YOU:  Understand these instructions.  Will watch your condition.  Will get help right away if you are not doing well or get worse.   This information is not intended to replace advice given to you by your health care provider. Make sure you discuss any questions you have with your health care provider.   Document Released: 07/16/2000 Document Revised: 07/05/2012 Document Reviewed: 05/23/2012 Elsevier Interactive Patient Education [employer]

## 2015-09-04 NOTE — ED Notes (Signed)
Patient now changing back into gown for MD.

## 2015-10-19 ENCOUNTER — Emergency Department (INDEPENDENT_AMBULATORY_CARE_PROVIDER_SITE_OTHER)
Admission: EM | Admit: 2015-10-19 | Discharge: 2015-10-19 | Disposition: A | Discharge status: Home / Self Care | Payer: Managed Care, Other (non HMO) | Source: Home / Self Care | Attending: Emergency Medicine | Admitting: Emergency Medicine

## 2015-10-19 ENCOUNTER — Encounter (HOSPITAL_COMMUNITY): Payer: Self-pay | Admitting: Nurse Practitioner

## 2015-10-19 DIAGNOSIS — J111 Influenza due to unidentified influenza virus with other respiratory manifestations: Secondary | ICD-10-CM

## 2015-10-19 DIAGNOSIS — R69 Illness, unspecified: Principal | ICD-10-CM

## 2015-10-19 MED ORDER — IPRATROPIUM BROMIDE 0.06 % NA SOLN
2.0000 | Freq: Four times a day (QID) | NASAL | Status: DC
Start: 2015-10-19 — End: 2017-09-23

## 2015-10-19 MED ORDER — HYDROCOD POLST-CPM POLST ER 10-8 MG/5ML PO SUER: 5.0000 mL | Freq: Two times a day (BID) | ORAL | Status: DC | PRN

## 2015-10-19 MED ORDER — IBUPROFEN 800 MG PO TABS: 800.0000 mg | ORAL_TABLET | Freq: Three times a day (TID) | ORAL | Status: DC

## 2015-10-19 NOTE — ED Notes (Signed)
Pt c/o 5 day history of nonproductive cough, fevers, body aches. hes been taking nyquil and mucinex with no relief. He took off work the last 2 days to rest but does not feel any better. He is A&Ox4, resp e/u

## 2015-10-19 NOTE — Discharge Instructions (Signed)
Take the medication as written. Take 1 gram of tylenol with the motrin up to 3 times a day as needed for pain and fever. This is an effective combination. Drink extra fluids. Start taking the mucinex d to keep the mucus secretions thin. Use a neti pot or the NeilMed sinus rinse as often as you want to to reduce nasal congestion. Follow the directions on the box. Return if you get worse, have a persistent fever >100.4, or for any concerns.   Go to www.goodrx.com to look up your medications. This will give you a list of where you can find your prescriptions at the most affordable prices.   Go to www.goodrx.com to look up your medications. This will give you a list of where you can find your prescriptions at the most affordable prices.   This practice is taking new patients. They will see you even if you do not have insurance.  [facility] [address] [place], [place]  [address] [phone]  If you have no primary doctor, here are some resources that may be helpful:  - [facility]: [address], [place], [place] [address] [place], [place] [address] [phone]  Medicaid-accepting [place] Providers: - [facility]- [address], [address]  [phone];   - [facility]- [address], [address] [phone]  - [facility]- [address], [address] [phone]  [facility]- [address] [place] [address] [phone]  - Renaye RakersVeita Bland- [address], [address] [phone]. Only accepts [place] Access Medicaid patients after they have her name applied to their card  -Dr. Greggory StallionGeorge Osei-Bonsu, [facility]. [address] [place] [address]    [place], [place] [address]  [phone]  Self Pay (no insurance) in [place]: - Sickle Cell Patients: Dr Willey BladeEric Dean, [facility] [address] [phone]  - Health [phone]- [phone]  -  Physician Referral Service- [phone]  - [facility]- [address], [address], [place], [phone];  Monday to Friday, 9 a.m. - 7 p.m.; Saturday 9 a.m. to 1 p.m.  [facility] [place]- [address] [place], [place] [phone]  - [facility]- [address] [place] [address] [phone] [facility]- [facility]- [address] [phone] [facility], [address] [place]; [phone]; or [address] [place] [address], [place]; [phone].   [facility] [place] [place], [place] [place] [place]; [phone]; Monday to Wednesday, 8:30 a.m. - 5 p.m.; Thursday, 8:30 a.m. - 8 p.m.  [facility], [address], [address], [place]; [phone]; Monday to Friday, 8 a.m. - 4:30 p.m.   [facility], [address] [place], [phone]; first and third Saturday of the month, 9:30 a.m. - 12:30 p.m.  [facility], [address] [place], [phone]; second Saturday of the month, 9 a.m. -noon.  [facility] for children. For information, call [phone]; [phone]; or [phone].  Other agencies that provide inexpensive medical care:     [facility]  [phone]    [facility]  [phone]    [facility]  [phone] [address] [place] [address]    [facility]  [phone]    [facility]  [phone], [phone]; or [phone].  Chronic Pain Problems Contact [facility]  [phone] Patients  need to be referred by their primary care doctor.  [facility]  [facility]     [employer]                          [facility] Dept. [address] [place]                       [address]      [address] [place] [address]   [phone] (After Hours)  General Information: Finding a doctor when you do not have health insurance can be tricky. Although  you are not limited by an insurance plan, you are of course limited by her finances and how much but he can pay out of pocket.  What are your options if you don't have health insurance?   1) Find a Librarian, academic and Pay Out of Pocket Although you won't have to find out who is covered by your insurance plan, it is a good idea to ask around and get recommendations. You will then need to call the office and see if the doctor you have chosen will accept you as a new patient and what types of options they offer for patients who are self-pay. Some doctors offer discounts or will set up payment plans for their patients who do not have insurance, but you will need to ask so you aren't surprised when you get to your appointment.  2) Contact Your Local Health Department Not all health departments have doctors that can see patients for sick visits, but many do, so it is worth a call to see if yours does. If you don't know where your local health department is, you can check in your phone book. The CDC also has a tool to help you locate your state's health department, and many state websites also have listings of all of their local health departments.  3) Find a Walk-in Clinic If your illness is not likely to be very severe or complicated, you may want to try a walk in clinic. These are popping up all over the country in pharmacies, drugstores, and shopping centers. They're usually staffed by nurse practitioners or physician assistants that have been trained to treat common illnesses and complaints. They're usually fairly quick and inexpensive. However, if you have serious medical issues or chronic medical problems, these are probably not your best option

## 2015-10-19 NOTE — ED Provider Notes (Signed)
HPI  SUBJECTIVE:  [name] is a 30 y.o. male who presents with body aches, headache, nasal congestion, sore throat, postnasal drip, weakness, fatigue, nonproductive cough for the past 5 days. Reports back and chest soreness from all the coughing. He denies rhinorrhea, sinus pain/pressure, ear pain, wheezing,, shortness of breath. States he is unable to sleep at night secondary to all the coughing. States that he feels feverish with chills, has no documented temperature. No allergy type symptoms. No nausea, vomiting. Has been taking NyQuil and Mucinex once without improvement. Symptoms are better with rest, no aggravating factors. No antipyretic in the past 4-6 hours. No sick contacts. He did not get a flu shot this year. PMD.: None. No history of asthma, emphysema, COPD, diabetes, hypertension. He is a smoker.     History reviewed. No pertinent past medical history.  Past Surgical History  Procedure Laterality Date  . Appendectomy      History reviewed. No pertinent family history.  Social History  Substance Use Topics  . Smoking status: Current Every Day Smoker  . Smokeless tobacco: None  . Alcohol Use: Yes    No current facility-administered medications for this encounter.  Current outpatient prescriptions:  .  chlorpheniramine-HYDROcodone (TUSSIONEX PENNKINETIC ER) 10-8 MG/5ML SUER, Take 5 mLs by mouth every 12 (twelve) hours as needed for cough., Disp: 120 mL, Rfl: 0 .  docusate sodium (COLACE) 100 MG capsule, Take 1 capsule (100 mg total) by mouth every 12 (twelve) hours., Disp: 60 capsule, Rfl: 0 .  ibuprofen (ADVIL,MOTRIN) 800 MG tablet, Take 1 tablet (800 mg total) by mouth 3 (three) times daily., Disp: 30 tablet, Rfl: 0 .  ipratropium (ATROVENT) 0.06 % nasal spray, Place 2 sprays into both nostrils 4 (four) times daily. 3-4 times/ day, Disp: 15 mL, Rfl: 0  No Known Allergies   ROS  As noted in HPI.   Physical Exam  BP 106/78 mmHg  Pulse 77  Temp(Src) 98.3 F  (36.8 C) (Oral)  Resp 18  SpO2 100%  Constitutional: Well developed, well nourished, no acute distress Eyes: PERRL, EOMI, conjunctiva normal bilaterally HENT: Normocephalic, atraumatic,mucus membranes moist TMs are bilaterally. Red, erythematous, swollen turbinates, clear nasal congestion. No sinus tenderness. Normal oropharynx with cobblestoning. Neck: No meningismus. Positive nontender cervical lymphadenopathy. Respiratory: Clear to auscultation bilaterally, no rales, no wheezing, no rhonchi Cardiovascular: Normal rate and rhythm, no murmurs, no gallops, no rubs GI: Soft, nondistended, normal bowel sounds, nontender, no rebound, no guarding Back: no CVAT skin: No rash, skin intact Musculoskeletal: No edema, no tenderness, no deformities Neurologic: Alert & oriented x 3, CN II-XII grossly intact, no motor deficits, sensation grossly intact Psychiatric: Speech and behavior appropriate   ED Course   Medications - No data to display  No orders of the defined types were placed in this encounter.   No results found for this or any previous visit (from the past 24 hour(s)). No results found.  ED Clinical Impression  Influenza-like illness  ED Assessment/Plan  Presentation consistent with influenza-like illness, however, patient is out of the window for Tamiflu. No clear indication for abx  at this time. Home with ibuprofen, Tussionex, saline nasal irrigation, nasal steroid, Mucinex D. Work note for 2 days. Primary care referral.  Discussed  MDM, plan and followup with patient. Discussed sn/sx that should prompt return to the ED. Patient  agrees with plan.   *This clinic note was created using Dragon dictation software. Therefore, there may be occasional mistakes despite careful proofreading.  ?  Domenick Gong, MD 10/19/15 1432

## 2015-12-04 ENCOUNTER — Encounter (HOSPITAL_COMMUNITY): Payer: Self-pay | Admitting: *Deleted

## 2015-12-04 ENCOUNTER — Emergency Department (HOSPITAL_COMMUNITY): Payer: Managed Care, Other (non HMO)

## 2015-12-04 ENCOUNTER — Emergency Department (HOSPITAL_COMMUNITY)
Admission: EM | Admit: 2015-12-04 | Discharge: 2015-12-04 | Disposition: A | Discharge status: Home / Self Care | Payer: Managed Care, Other (non HMO) | Attending: Emergency Medicine | Admitting: Emergency Medicine

## 2015-12-04 DIAGNOSIS — F17200 Nicotine dependence, unspecified, uncomplicated: Secondary | ICD-10-CM | POA: Diagnosis not present

## 2015-12-04 DIAGNOSIS — Y9389 Activity, other specified: Secondary | ICD-10-CM | POA: Insufficient documentation

## 2015-12-04 DIAGNOSIS — Y9289 Other specified places as the place of occurrence of the external cause: Secondary | ICD-10-CM | POA: Diagnosis not present

## 2015-12-04 DIAGNOSIS — S01312A Laceration without foreign body of left ear, initial encounter: Secondary | ICD-10-CM | POA: Diagnosis not present

## 2015-12-04 DIAGNOSIS — Y998 Other external cause status: Secondary | ICD-10-CM | POA: Diagnosis not present

## 2015-12-04 DIAGNOSIS — S52202A Unspecified fracture of shaft of left ulna, initial encounter for closed fracture: Secondary | ICD-10-CM

## 2015-12-04 DIAGNOSIS — S52602A Unspecified fracture of lower end of left ulna, initial encounter for closed fracture: Secondary | ICD-10-CM | POA: Insufficient documentation

## 2015-12-04 DIAGNOSIS — S4992XA Unspecified injury of left shoulder and upper arm, initial encounter: Secondary | ICD-10-CM | POA: Diagnosis present

## 2015-12-04 MED ORDER — IBUPROFEN 800 MG PO TABS
800.0000 mg | ORAL_TABLET | Freq: Once | ORAL | Status: AC
  Administered 2015-12-04: 800 mg via ORAL
  Filled 2015-12-04: qty 1

## 2015-12-04 MED ORDER — IBUPROFEN 800 MG PO TABS: 800.0000 mg | ORAL_TABLET | Freq: Three times a day (TID) | ORAL | Status: DC
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
## 2015-12-04 NOTE — ED Notes (Signed)
Pt is refusing vitals 

## 2015-12-04 NOTE — Discharge Instructions (Signed)
Facial Laceration ° A facial laceration is a cut on the face. These injuries can be painful and cause bleeding. Lacerations usually heal quickly, but they need special care to reduce scarring. °DIAGNOSIS  °Your health care provider will take a medical history, ask for details about how the injury occurred, and examine the wound to determine how deep the cut is. °TREATMENT  °Some facial lacerations may not require closure. Others may not be able to be closed because of an increased risk of infection. The risk of infection and the chance for successful closure will depend on various factors, including the amount of time since the injury occurred. °The wound may be cleaned to help prevent infection. If closure is appropriate, pain medicines may be given if needed. Your health care provider will use stitches (sutures), wound glue (adhesive), or skin adhesive strips to repair the laceration. These tools bring the skin edges together to allow for faster healing and a better cosmetic outcome. If needed, you may also be given a tetanus shot. °HOME CARE INSTRUCTIONS °· Only take over-the-counter or prescription medicines as directed by your health care provider. °· Follow your health care provider's instructions for wound care. These instructions will vary depending on the technique used for closing the wound. °For Sutures: °· Keep the wound clean and dry.   °· If you were given a bandage (dressing), you should change it at least once a day. Also change the dressing if it becomes wet or dirty, or as directed by your health care provider.   °· Wash the wound with soap and water 2 times a day. Rinse the wound off with water to remove all soap. Pat the wound dry with a clean towel.   °· After cleaning, apply a thin layer of the antibiotic ointment recommended by your health care provider. This will help prevent infection and keep the dressing from sticking.   °· You may shower as usual after the first 24 hours. Do not soak the  wound in water until the sutures are removed.   °· Get your sutures removed as directed by your health care provider. With facial lacerations, sutures should usually be taken out after 4-5 days to avoid stitch marks.   °· Wait a few days after your sutures are removed before applying any makeup. °For Skin Adhesive Strips: °· Keep the wound clean and dry.   °· Do not get the skin adhesive strips wet. You may bathe carefully, using caution to keep the wound dry.   °· If the wound gets wet, pat it dry with a clean towel.   °· Skin adhesive strips will fall off on their own. You may trim the strips as the wound heals. Do not remove skin adhesive strips that are still stuck to the wound. They will fall off in time.   °For Wound Adhesive: °· You may briefly wet your wound in the shower or bath. Do not soak or scrub the wound. Do not swim. Avoid periods of heavy sweating until the skin adhesive has fallen off on its own. After showering or bathing, gently pat the wound dry with a clean towel.   °· Do not apply liquid medicine, cream medicine, ointment medicine, or makeup to your wound while the skin adhesive is in place. This may loosen the film before your wound is healed.   °· If a dressing is placed over the wound, be careful not to apply tape directly over the skin adhesive. This may cause the adhesive to be pulled off before the wound is healed.   °· Avoid   prolonged exposure to sunlight or tanning lamps while the skin adhesive is in place.  The skin adhesive will usually remain in place for 5-10 days, then naturally fall off the skin. Do not pick at the adhesive film.  After Healing: Once the wound has healed, cover the wound with sunscreen during the day for 1 full year. This can help minimize scarring. Exposure to ultraviolet light in the first year will darken the scar. It can take 1-2 years for the scar to lose its redness and to heal completely.  SEEK MEDICAL CARE IF:  You have a fever. SEEK IMMEDIATE  MEDICAL CARE IF:  You have redness, pain, or swelling around the wound.   You see ayellowish-white fluid (pus) coming from the wound.    This information is not intended to replace advice given to you by your health care provider. Make sure you discuss any questions you have with your health care provider.   Document Released: 08/26/2004 Document Revised: 08/09/2014 Document Reviewed: 03/01/2013 Elsevier Interactive Patient Education 2016 [employer]  Forearm Fracture A forearm fracture is a break in one or both of the bones of your arm that are between the elbow and the wrist. Your forearm is made up of two bones:  Radius. This is the bone on the inside of your arm near your thumb.  Ulna. This is the bone on the outside of your arm near your little finger. Middle forearm fractures usually break both the radius and the ulna. Most forearm fractures that involve both the ulna and radius will require surgery. CAUSES Common causes of this type of fracture include:  Falling on an outstretched arm.  Accidents, such as a car or bike accident.  A hard, direct hit to the middle part of your arm. RISK FACTORS You may be at higher risk for this type of fracture if:  You play contact sports.  You have a condition that causes your bones to be weak or thin (osteoporosis). SIGNS AND SYMPTOMS A forearm fracture causes pain immediately after the injury. Other signs and symptoms include:  An abnormal bend or bump in your arm (deformity).  Swelling.  Numbness or tingling.  Tenderness.  Inability to turn your hand from side to side (rotate).  Bruising. DIAGNOSIS Your health care provider may diagnose a forearm fracture based on:  Your symptoms.  Your medical history, including any recent injury.  A physical exam. Your health care provider will look for any deformity and feel for tenderness over the break. Your health care provider will also check whether the bones are out of  place.  An X-ray exam to confirm the diagnosis and learn more about the type of fracture. TREATMENT The goals of treatment are to get the bone or bones in proper position for healing and to keep the bones from moving so they will heal over time. Your treatment will depend on many factors, especially the type of fracture that you have.  If the fractured bone or bones:  Are in the correct position (nondisplaced), you may only need to wear a cast or a splint.  Have a slightly displaced fracture, you may need to have the bones moved back into place manually (closed reduction) before the splint or cast is put on.  You may have a temporary splint before you have a cast. The splint allows room for some swelling. After a few days, a cast can replace the splint.  You may have to wear the cast for 6-8 weeks or  as directed by your health care provider.  The cast may be changed after about 3 weeks or as directed by your health care provider.  After your cast is removed, you may need physical therapy to regain full movement in your wrist or elbow.  You may need emergency surgery if you have:  A fractured bone or bones that are out of position (displaced).  A fracture with multiple fragments (comminuted fracture).  A fracture that breaks the skin (open fracture). This type of fracture may require surgical wires, plates, or screws to hold the bone or bones in place.  You may have X-rays every couple of weeks to check on your healing. HOME CARE INSTRUCTIONS If You Have a Cast:  Do not stick anything inside the cast to scratch your skin. Doing that increases your risk of infection.  Check the skin around the cast every day. Report any concerns to your health care provider. You may put lotion on dry skin around the edges of the cast. Do not apply lotion to the skin underneath the cast. If You Have a Splint:  Wear it as directed by your health care provider. Remove it only as directed by your  health care provider.  Loosen the splint if your fingers become numb and tingle, or if they turn cold and blue. Bathing  Cover the cast or splint with a watertight plastic bag to protect it from water while you bathe or shower. Do not let the cast or splint get wet. Managing Pain, Stiffness, and Swelling  If directed, apply ice to the injured area:  Put ice in a plastic bag.  Place a towel between your skin and the bag.  Leave the ice on for 20 minutes, 2-3 times a day.  Move your fingers often to avoid stiffness and to lessen swelling.  Raise the injured area above the level of your heart while you are sitting or lying down. Driving  Do not drive or operate heavy machinery while taking pain medicine.  Do not drive while wearing a cast or splint on a hand that you use for driving. Activity  Return to your normal activities as directed by your health care provider. Ask your health care provider what activities are safe for you.  Perform range-of-motion exercises only as directed by your health care provider. Safety  Do not use your injured limb to support your body weight until your health care provider says that you can. General Instructions  Do not put pressure on any part of the cast or splint until it is fully hardened. This may take several hours.  Keep the cast or splint clean and dry.  Do not use any tobacco products, including cigarettes, chewing tobacco, or electronic cigarettes. Tobacco can delay bone healing. If you need help quitting, ask your health care provider.  Take medicines only as directed by your health care provider.  Keep all follow-up visits as directed by your health care provider. This is important. SEEK MEDICAL CARE IF:  Your pain medicine is not helping.  Your cast or splint becomes wet or damaged or suddenly feels too tight.  Your cast becomes loose.  You have more severe pain or swelling than you did before the cast.  You have severe  pain when you stretch your fingers.  You continue to have pain or stiffness in your elbow or your wrist after your cast is removed. SEEK IMMEDIATE MEDICAL CARE IF:  You cannot move your fingers.  You lose feeling in  your fingers or your hand.  Your hand or your fingers turn cold and pale or blue.  You notice a bad smell coming from your cast.  You have drainage from underneath your cast.  You have new stains from blood or drainage that is coming through your cast.   This information is not intended to replace advice given to you by your health care provider. Make sure you discuss any questions you have with your health care provider.   Document Released: 07/16/2000 Document Revised: 08/09/2014 Document Reviewed: 03/04/2014 Elsevier Interactive Patient Education [employer]2016 Else[employer] Inc.
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
## 2015-12-04 NOTE — Progress Notes (Signed)
Orthopedic Tech Progress Note Patient Details:  Aaron Elnoria HowardA XXXAhmed 08/04/1985 [id number]  Ortho Devices Type of Ortho Device: Ace wrap, Arm sling, Sugartong splint Ortho Device/Splint Location: lue Ortho Device/Splint Interventions: Application   [name] 12/04/2015, 9:15 AM

## 2017-01-06 ENCOUNTER — Encounter (HOSPITAL_COMMUNITY): Payer: Self-pay | Admitting: Emergency Medicine

## 2017-01-06 ENCOUNTER — Emergency Department (HOSPITAL_COMMUNITY)
Admission: EM | Admit: 2017-01-06 | Discharge: 2017-01-06 | Disposition: A | Discharge status: Home / Self Care | Payer: 59 | Attending: Emergency Medicine | Admitting: Emergency Medicine

## 2017-01-06 DIAGNOSIS — K4090 Unilateral inguinal hernia, without obstruction or gangrene, not specified as recurrent: Secondary | ICD-10-CM | POA: Diagnosis not present

## 2017-01-06 DIAGNOSIS — R2241 Localized swelling, mass and lump, right lower limb: Secondary | ICD-10-CM | POA: Diagnosis not present

## 2017-01-06 DIAGNOSIS — F17210 Nicotine dependence, cigarettes, uncomplicated: Secondary | ICD-10-CM | POA: Insufficient documentation

## 2017-01-06 HISTORY — DX: Cardiac murmur, unspecified: R01.1
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
## 2017-01-06 NOTE — Discharge Instructions (Signed)
As we discussed, you have a small hernia on the right.  You may follow-up with the general surgery clinic for evaluation about having this fixed. Patient return here for any new or worsening symptoms including severe pain, testicular swelling, difficulty urinating, nausea, vomiting, etc.

## 2017-01-06 NOTE — ED Provider Notes (Signed)
[facility] Provider Note   CSN: [id number] Arrival date & time: 01/06/17  0502     History   Chief Complaint Chief Complaint  Patient presents with  . Groin Swelling    "lump" on R side    HPI [name] is a 31 y.o. male.  The history is provided by the patient and medical records.    31 year old male here with "lump" in the right side of his groin. States this is been there for several months but he feels it may be getting larger. States he was seen for this previously and told it was a small hernia. States he does have a physical job, lifting 35-40 pounds on a daily basis. States no pain with working or other activities.  He denies any abdominal pain, nausea, vomiting, or diarrhea. Has not had any urinary symptoms. No penile discharge or concern for STD.  Past Medical History:  Diagnosis Date  . Heart murmur     Patient Active Problem List   Diagnosis Date Noted  . Postop check 06/22/2011  . Appendicitis with perforation 06/07/2011  . Appendicitis with abscess 06/07/2011  . Urinary retention with incomplete bladder emptying 06/07/2011  . Perforated appendicitis 06/06/2011    Past Surgical History:  Procedure Laterality Date  . APPENDECTOMY         Home Medications    Prior to Admission medications   Medication Sig Start Date End Date Taking? Authorizing Provider  ibuprofen (ADVIL,MOTRIN) 800 MG tablet Take 1 tablet (800 mg total) by mouth 3 (three) times daily. 12/04/15   Linwood Dibbles, MD    Family History No family history on file.  Social History Social History  Substance Use Topics  . Smoking status: Current Every Day Smoker    Packs/day: 1.00    Types: Cigarettes  . Smokeless tobacco: Never Used  . Alcohol use Yes     Allergies   Patient has no known allergies.   Review of Systems Review of Systems  Genitourinary:       +groin lump  All other systems reviewed and are negative.    Physical Exam Updated Vital Signs BP (!)  124/91 (BP Location: Left Arm)   Pulse 70   Temp 98 F (36.7 C) (Oral)   Resp 14   Ht 5\' 9"  (1.753 m)   Wt 63.5 kg (140 lb)   SpO2 98%   BMI 20.67 kg/m   Physical Exam  Constitutional: He is oriented to person, place, and time. He appears well-developed and well-nourished.  HENT:  Head: Normocephalic and atraumatic.  Mouth/Throat: Oropharynx is clear and moist.  Eyes: Conjunctivae and EOM are normal. Pupils are equal, round, and reactive to light.  Neck: Normal range of motion.  Cardiovascular: Normal rate, regular rhythm and normal heart sounds.   Pulmonary/Chest: Effort normal and breath sounds normal.  Abdominal: Soft. Bowel sounds are normal.  Genitourinary:  Genitourinary Comments: Small right-sided inguinal hernia noted protrudes with valsavla but is easily reducible; there is no significant testicular swelling or tenderness, normal lie, no penile discharge noted  Musculoskeletal: Normal range of motion.  Neurological: He is alert and oriented to person, place, and time.  Skin: Skin is warm and dry.  Psychiatric: He has a normal mood and affect.  Nursing note and vitals reviewed.    ED Treatments / Results  Labs (all labs ordered are listed, but only abnormal results are displayed) Labs Reviewed - No data to display  EKG  EKG Interpretation None  Radiology No results found.  Procedures Procedures (including critical care time)  Medications Ordered in ED Medications - No data to display   Initial Impression / Assessment and Plan / ED Course  I have reviewed the triage vital signs and the nursing notes.  Pertinent labs & imaging results that were available during my care of the patient were reviewed by me and considered in my medical decision making (see chart for details).  31 year old male here with right groin swelling. No significant pain. Does have an apparent small inguinal hernia. This protrudes with Valsalva but is easily reducible. No  significant testicular swelling or tenderness. No discharge or urinary symptoms.  Will have him follow-up with [facility] for repair if desired.  He understands to return here for any new/worsening symptoms such as severe pain, N/V, dysuria, etc.  Final Clinical Impressions(s) / ED Diagnoses   Final diagnoses:  Right inguinal hernia    New Prescriptions New Prescriptions   No medications on file     Garlon HatchetSanders, Allante Beane M, PA-C 01/06/17 0631    Geoffery Lyonselo, Douglas, MD 01/06/17 [phone]

## 2017-01-11 ENCOUNTER — Ambulatory Visit (HOSPITAL_COMMUNITY)
Admission: EM | Admit: 2017-01-11 | Discharge: 2017-01-11 | Disposition: A | Discharge status: Home / Self Care | Payer: 59 | Attending: Internal Medicine | Admitting: Internal Medicine

## 2017-01-11 ENCOUNTER — Encounter (HOSPITAL_COMMUNITY): Payer: Self-pay | Admitting: *Deleted

## 2017-01-11 DIAGNOSIS — S76211A Strain of adductor muscle, fascia and tendon of right thigh, initial encounter: Secondary | ICD-10-CM

## 2017-01-11 MED ORDER — NAPROXEN 500 MG PO TABS
500.0000 mg | ORAL_TABLET | Freq: Two times a day (BID) | ORAL | 0 refills | Status: DC
Start: 2017-01-11 — End: 2017-01-11

## 2017-01-11 MED ORDER — NAPROXEN 500 MG PO TABS: 500.0000 mg | ORAL_TABLET | Freq: Two times a day (BID) | ORAL | 0 refills | Status: DC

## 2017-01-11 NOTE — Discharge Instructions (Signed)
I did not appreciate an inguinal hernia on exam today.  You have been diagnosed with inguinal hernia in past and should follow up with the urologist you were referred to by the ED.  You have right Groin tenderness with exam today.  Please follow up as necessary.  [facility] and they can also help get you a referral to a Urologist if you need help.

## 2017-01-11 NOTE — ED Provider Notes (Signed)
CSN: [id number]     Arrival date & time 01/11/17  1748 History   First MD Initiated Contact with Patient 01/11/17 1808     Chief Complaint  Patient presents with  . Abdominal Pain   (Consider location/radiation/quality/duration/timing/severity/associated sxs/prior Treatment) Patient c/o right groin pain.  He states he was diagnosed with right inguinal hernia in past and has not followed up with the urologist yet.   The history is provided by the patient.  Abdominal Pain  Pain location:  Generalized Pain radiates to:  Does not radiate Worsened by:  Nothing Ineffective treatments:  None tried   Past Medical History:  Diagnosis Date  . Heart murmur    Past Surgical History:  Procedure Laterality Date  . APPENDECTOMY     History reviewed. No pertinent family history. Social History  Substance Use Topics  . Smoking status: Current Every Day Smoker    Packs/day: 1.00    Types: Cigarettes  . Smokeless tobacco: Never Used  . Alcohol use Yes    Review of Systems  Constitutional: Negative.   HENT: Negative.   Eyes: Negative.   Respiratory: Negative.   Cardiovascular: Negative.   Gastrointestinal: Positive for abdominal pain.  Endocrine: Negative.   Genitourinary: Negative.   Musculoskeletal: Negative.   Allergic/Immunologic: Negative.   Neurological: Negative.   Hematological: Negative.   Psychiatric/Behavioral: Negative.     Allergies  Patient has no known allergies.  Home Medications   Prior to Admission medications   Medication Sig Start Date End Date Taking? Authorizing Provider  ibuprofen (ADVIL,MOTRIN) 800 MG tablet Take 1 tablet (800 mg total) by mouth 3 (three) times daily. 12/04/15   Linwood DibblesKnapp, Jon, MD  naproxen (NAPROSYN) 500 MG tablet Take 1 tablet (500 mg total) by mouth 2 (two) times daily with a meal. 01/11/17   Oxford, Anselm PancoastWilliam J, FNP   Meds Ordered and Administered this Visit  Medications - No data to display  BP 125/83 (BP Location: Right Arm)   Pulse  68   Temp 98.3 F (36.8 C) (Oral)   Resp 18   SpO2 99%  No data found.   Physical Exam  Constitutional: He is oriented to person, place, and time. He appears well-developed and well-nourished.  HENT:  Head: Normocephalic and atraumatic.  Eyes: Conjunctivae and EOM are normal. Pupils are equal, round, and reactive to light.  Neck: Normal range of motion. Neck supple.  Cardiovascular: Normal rate, regular rhythm and normal heart sounds.   Pulmonary/Chest: Effort normal and breath sounds normal.  Abdominal: Soft. Bowel sounds are normal.  Neurological: He is alert and oriented to person, place, and time.  Nursing note and vitals reviewed.   Urgent Care Course     Procedures (including critical care time)  Labs Review Labs Reviewed - No data to display  Imaging Review No results found.   Visual Acuity Review  Right Eye Distance:   Left Eye Distance:   Bilateral Distance:    Right Eye Near:   Left Eye Near:    Bilateral Near:         MDM   1. Groin strain, right, initial encounter    Discussed him following up with Urologist he was referred for inguinal hernia.  Explained he did not have one today on exam and his pain is not from hernia but from a groin strain.  Naprosyn 500mg  one po bid x 10 days #20      Deatra CanterOxford, William J, FNP 01/11/17 1834    Deatra Canterxford, William J,  FNP 01/11/17 1836

## 2017-01-11 NOTE — ED Triage Notes (Signed)
Pain  r  Inguinal  Area   Pt  States  It   Worse     When    He  Woke  Nausea     And  Dizzy          Pt  Ambulated   To  Room    Feels  Tired

## 2017-01-21 ENCOUNTER — Encounter (HOSPITAL_COMMUNITY): Payer: Self-pay | Admitting: *Deleted

## 2017-01-21 ENCOUNTER — Emergency Department (HOSPITAL_COMMUNITY)
Admission: EM | Admit: 2017-01-21 | Discharge: 2017-01-21 | Disposition: A | Discharge status: Home / Self Care | Payer: 59 | Attending: Emergency Medicine | Admitting: Emergency Medicine

## 2017-01-21 DIAGNOSIS — R1031 Right lower quadrant pain: Secondary | ICD-10-CM | POA: Diagnosis not present

## 2017-01-21 DIAGNOSIS — R109 Unspecified abdominal pain: Secondary | ICD-10-CM | POA: Insufficient documentation

## 2017-01-21 DIAGNOSIS — F17210 Nicotine dependence, cigarettes, uncomplicated: Secondary | ICD-10-CM | POA: Insufficient documentation

## 2017-01-21 LAB — URINALYSIS, ROUTINE W REFLEX MICROSCOPIC
BILIRUBIN URINE: NEGATIVE
GLUCOSE, UA: NEGATIVE mg/dL
HGB URINE DIPSTICK: NEGATIVE
Ketones, ur: 5 mg/dL — AB
NITRITE: NEGATIVE
Protein, ur: NEGATIVE mg/dL
SPECIFIC GRAVITY, URINE: 1.011 (ref 1.005–1.030)
Squamous Epithelial / LPF: NONE SEEN
pH: 5 (ref 5.0–8.0)

## 2017-01-21 LAB — BASIC METABOLIC PANEL
Anion gap: 8 (ref 5–15)
BUN: 10 mg/dL (ref 6–20)
CHLORIDE: 105 mmol/L (ref 101–111)
CO2: 26 mmol/L (ref 22–32)
CREATININE: 0.75 mg/dL (ref 0.61–1.24)
Calcium: 9.5 mg/dL (ref 8.9–10.3)
GFR calc Af Amer: >60 mL/min (ref >60)
GFR calc non Af Amer: >60 mL/min (ref >60)
GLUCOSE: 91 mg/dL (ref 65–99)
Potassium: 4.2 mmol/L (ref 3.5–5.1)
Sodium: 139 mmol/L (ref 135–145)

## 2017-01-21 LAB — CBC
HCT: 40.4 % (ref 39.0–52.0)
Hemoglobin: 13.7 g/dL (ref 13.0–17.0)
MCH: 30.2 pg (ref 26.0–34.0)
MCHC: 33.9 g/dL (ref 30.0–36.0)
MCV: 89.2 fL (ref 78.0–100.0)
PLATELETS: 224 10*3/uL (ref 150–400)
RBC: 4.53 MIL/uL (ref 4.22–5.81)
RDW: 14 % (ref 11.5–15.5)
WBC: 5.6 10*3/uL (ref 4.0–10.5)

## 2017-01-21 NOTE — ED Notes (Signed)
Pt ambulated to the restroom without difficulty, gait even and steady.

## 2017-01-21 NOTE — ED Triage Notes (Signed)
Pt states R flank pain x 2 weeks that is getting worse.  Pain improves with urination.

## 2017-01-21 NOTE — ED Notes (Signed)
This RN went through pt discharge instructions with pt. Pt requesting xray.

## 2017-01-21 NOTE — ED Provider Notes (Signed)
[facility] Provider Note   CSN: [id number] Arrival date & time: 01/21/17  1756     History   Chief Complaint Chief Complaint  Patient presents with  . Flank Pain    HPI [name] is a 31 y.o. male.  Patient without significant medical history presents with discomfort in right lateral abdomen x 2 weeks, that became more painful last night. He states he rested last night and the pain was better this morning. He went to work at his job in a [employer] and the area became painful again throughout the day. He took some ibuprofen prior to arrival and reports his pain has resolved. No fever, vomiting, bowel movement changes, hematuria, dysuria or groin pain.    The history is provided by the patient. No language interpreter was used.    Past Medical History:  Diagnosis Date  . Heart murmur     Patient Active Problem List   Diagnosis Date Noted  . Postop check 06/22/2011  . Appendicitis with perforation 06/07/2011  . Appendicitis with abscess 06/07/2011  . Urinary retention with incomplete bladder emptying 06/07/2011  . Perforated appendicitis 06/06/2011    Past Surgical History:  Procedure Laterality Date  . APPENDECTOMY         Home Medications    Prior to Admission medications   Medication Sig Start Date End Date Taking? Authorizing Provider  ibuprofen (ADVIL,MOTRIN) 800 MG tablet Take 1 tablet (800 mg total) by mouth 3 (three) times daily. 12/04/15   Linwood DibblesKnapp, Jon, MD  naproxen (NAPROSYN) 500 MG tablet Take 1 tablet (500 mg total) by mouth 2 (two) times daily with a meal. 01/11/17   Oxford, Anselm PancoastWilliam J, FNP    Family History No family history on file.  Social History Social History  Substance Use Topics  . Smoking status: Current Every Day Smoker    Packs/day: 1.00    Types: Cigarettes  . Smokeless tobacco: Never Used  . Alcohol use Yes     Comment: several 12 oz beers per day     Allergies   Patient has no known  allergies.   Review of Systems Review of Systems  Constitutional: Negative for chills and fever.  Gastrointestinal: Positive for abdominal pain. Negative for constipation, diarrhea and vomiting.  Genitourinary: Negative for dysuria and hematuria.  Musculoskeletal: Negative.   Skin: Negative.   Neurological: Negative.      Physical Exam Updated Vital Signs BP (!) 140/99   Pulse 66   Temp 98.1 F (36.7 C) (Oral)   Resp 16   Ht 5\' 9"  (1.753 m)   Wt 61.2 kg (135 lb)   SpO2 100%   BMI 19.94 kg/m   Physical Exam  Constitutional: He is oriented to person, place, and time. He appears well-developed and well-nourished.  Neck: Normal range of motion.  Pulmonary/Chest: Effort normal.  Abdominal: Soft. He exhibits no mass. There is tenderness. There is no guarding.    Minimal tenderness to right lateral mid-abdomen. No swelling.   Genitourinary:  Genitourinary Comments: No CVA tenderness.   Musculoskeletal: Normal range of motion.  Neurological: He is alert and oriented to person, place, and time.  Skin: Skin is warm and dry.  Psychiatric: He has a normal mood and affect.     ED Treatments / Results  Labs (all labs ordered are listed, but only abnormal results are displayed) Labs Reviewed  URINALYSIS, ROUTINE W REFLEX MICROSCOPIC - Abnormal; Notable for the following:       Result Value  Ketones, ur 5 (*)    Leukocytes, UA MODERATE (*)    Bacteria, UA RARE (*)    All other components within normal limits  BASIC METABOLIC PANEL  CBC    EKG  EKG Interpretation None       Radiology No results found.  Procedures Procedures (including critical care time)  Medications Ordered in ED Medications - No data to display   Initial Impression / Assessment and Plan / ED Course  I have reviewed the triage vital signs and the nursing notes.  Pertinent labs & imaging results that were available during my care of the patient were reviewed by me and considered in my  medical decision making (see chart for details).     Patient with right lateral abdominal pain that is better with rest and ibuprofen, worse with activity. Labs reassuring, VSS. Feel the complaint follows muscular pattern. He can continue taking ibuprofen. Encouraged to establish with PCP  Final Clinical Impressions(s) / ED Diagnoses   Final diagnoses:  None   1. Muscular abdominal wall pain  New Prescriptions New Prescriptions   No medications on file     Danne Harbor 01/21/17 2258    Rolland Porter, MD 01/30/17 2342

## 2017-01-21 NOTE — Discharge Instructions (Signed)
Continue taking ibuprofen. Use warm compresses. Follow up with primary care for recheck if pain does not resolve in 2-3 days.

## 2017-04-08 ENCOUNTER — Ambulatory Visit (HOSPITAL_COMMUNITY)
Admission: EM | Admit: 2017-04-08 | Discharge: 2017-04-08 | Disposition: A | Discharge status: Home / Self Care | Payer: 59 | Attending: Emergency Medicine | Admitting: Emergency Medicine

## 2017-04-08 ENCOUNTER — Encounter (HOSPITAL_COMMUNITY): Payer: Self-pay | Admitting: Family Medicine

## 2017-04-08 DIAGNOSIS — R197 Diarrhea, unspecified: Secondary | ICD-10-CM | POA: Diagnosis not present

## 2017-04-08 MED ORDER — DICYCLOMINE HCL 20 MG PO TABS: 20.0000 mg | ORAL_TABLET | Freq: Two times a day (BID) | ORAL | 0 refills | Status: DC

## 2017-04-08 MED ORDER — ONDANSETRON HCL 4 MG PO TABS: 4.0000 mg | ORAL_TABLET | Freq: Four times a day (QID) | ORAL | 0 refills | Status: DC

## 2017-04-08 NOTE — Discharge Instructions (Signed)
Take zofran for nausea/vomiting. Take bentyl for abdominal cramping. Start bland diet, advance as tolerated. Keep hydrated, you urine should be clear to pale yellow in color. Take probiotics once diarrhea resolves to help regain normal bowel movements. Monitor for worsening of symptoms, worsening abdominal pain, nausea and vomiting not controlled by medication, fever, weakness, dizziness, follow-up for reevaluation.

## 2017-04-08 NOTE — ED Triage Notes (Signed)
Pt here for diarrhea and nausea since Monday. sts that he ate some food that sat out all night and it started after that. sts rectal pain and abd cramping.

## 2017-04-08 NOTE — ED Provider Notes (Signed)
[facility]    CSN: [id number] Arrival date & time: 04/08/17  1422     History   Chief Complaint Chief Complaint  Patient presents with  . Diarrhea  . Abdominal Pain  . Nausea    HPI [name] is a 31 y.o. male.   31 year old male comes in for 2 day history of diarrhea, nausea, with abdominal cramping. Patient states he ate left overs that was left in the microwave the day before symptoms started. He has had diarrhea with associated abdominal cramping since. Denies blood in stool, vomiting. He states he now has some rectal pain during diarrhea. Denies fever. Denies URI symptoms such as cough, congestion, ear pain, eye pain. Denies recent travel, recent antibiotic use. Denies weakness, dizziness. States he has continued to go to work without problems.       Past Medical History:  Diagnosis Date  . Heart murmur     Patient Active Problem List   Diagnosis Date Noted  . Postop check 06/22/2011  . Appendicitis with perforation 06/07/2011  . Appendicitis with abscess 06/07/2011  . Urinary retention with incomplete bladder emptying 06/07/2011  . Perforated appendicitis 06/06/2011    Past Surgical History:  Procedure Laterality Date  . APPENDECTOMY         Home Medications    Prior to Admission medications   Medication Sig Start Date End Date Taking? Authorizing Provider  dicyclomine (BENTYL) 20 MG tablet Take 1 tablet (20 mg total) by mouth 2 (two) times daily. 04/08/17 04/13/17  Belinda Fisher, PA-C  ibuprofen (ADVIL,MOTRIN) 800 MG tablet Take 1 tablet (800 mg total) by mouth 3 (three) times daily. 12/04/15   Linwood Dibbles, MD  naproxen (NAPROSYN) 500 MG tablet Take 1 tablet (500 mg total) by mouth 2 (two) times daily with a meal. 01/11/17   Oxford, Anselm Pancoast, FNP  ondansetron (ZOFRAN) 4 MG tablet Take 1 tablet (4 mg total) by mouth every 6 (six) hours. 04/08/17   Belinda Fisher, PA-C    Family History History reviewed. No pertinent family history.  Social  History Social History  Substance Use Topics  . Smoking status: Current Every Day Smoker    Packs/day: 1.00    Types: Cigarettes  . Smokeless tobacco: Never Used  . Alcohol use Yes     Comment: several 12 oz beers per day     Allergies   Patient has no known allergies.   Review of Systems Review of Systems  Reason unable to perform ROS: See HPI as above.     Physical Exam Triage Vital Signs ED Triage Vitals [04/08/17 1446]  Enc Vitals Group     BP 116/80     Pulse Rate 76     Resp 18     Temp 98 F (36.7 C)     Temp Source Oral     SpO2 100 %     Weight      Height      Head Circumference      Peak Flow      Pain Score      Pain Loc      Pain Edu?      Excl. in GC?    No data found.   Updated Vital Signs BP 116/80   Pulse 76   Temp 98 F (36.7 C) (Oral)   Resp 18   SpO2 100%   Visual Acuity Right Eye Distance:   Left Eye Distance:   Bilateral Distance:  Right Eye Near:   Left Eye Near:    Bilateral Near:     Physical Exam  Constitutional: He is oriented to person, place, and time. He appears well-developed and well-nourished. No distress.  HENT:  Head: Normocephalic and atraumatic.  Right Ear: Tympanic membrane, external ear and ear canal normal. Tympanic membrane is not erythematous and not bulging.  Left Ear: Tympanic membrane, external ear and ear canal normal. Tympanic membrane is not erythematous and not bulging.  Nose: Nose normal. Right sinus exhibits no maxillary sinus tenderness and no frontal sinus tenderness. Left sinus exhibits no maxillary sinus tenderness and no frontal sinus tenderness.  Mouth/Throat: Uvula is midline, oropharynx is clear and moist and mucous membranes are normal.  Eyes: Pupils are equal, round, and reactive to light. Conjunctivae are normal.  Neck: Normal range of motion. Neck supple.  Cardiovascular: Normal rate, regular rhythm and normal heart sounds.  Exam reveals no gallop and no friction rub.   No murmur  heard. Pulmonary/Chest: Effort normal and breath sounds normal. He has no decreased breath sounds. He has no wheezes. He has no rhonchi. He has no rales.  Abdominal: Soft. Bowel sounds are normal. He exhibits no mass. There is no tenderness. There is no rebound and no guarding.  Lymphadenopathy:    He has no cervical adenopathy.  Neurological: He is alert and oriented to person, place, and time.  Skin: Skin is warm and dry.  Psychiatric: He has a normal mood and affect. His behavior is normal. Judgment normal.     UC Treatments / Results  Labs (all labs ordered are listed, but only abnormal results are displayed) Labs Reviewed - No data to display  EKG  EKG Interpretation None       Radiology No results found.  Procedures Procedures (including critical care time)  Medications Ordered in UC Medications - No data to display   Initial Impression / Assessment and Plan / UC Course  I have reviewed the triage vital signs and the nursing notes.  Pertinent labs & imaging results that were available during my care of the patient were reviewed by me and considered in my medical decision making (see chart for details).     Zofran for nausea or vomiting. Bentyl for abdominal cramping. Push fluids. Offered stool culture, as patient was asking about medications to stop the diarrhea. Patient declined stool culture. Return precautions given.  Final Clinical Impressions(s) / UC Diagnoses   Final diagnoses:  Diarrhea, unspecified type    New Prescriptions New Prescriptions   DICYCLOMINE (BENTYL) 20 MG TABLET    Take 1 tablet (20 mg total) by mouth 2 (two) times daily.   ONDANSETRON (ZOFRAN) 4 MG TABLET    Take 1 tablet (4 mg total) by mouth every 6 (six) hours.       Belinda FisherYu, Kasidy Gianino V, PA-C 04/08/17 1605

## 2017-04-30 ENCOUNTER — Ambulatory Visit (HOSPITAL_COMMUNITY)
Admission: EM | Admit: 2017-04-30 | Discharge: 2017-04-30 | Disposition: A | Discharge status: Home / Self Care | Payer: 59

## 2017-04-30 ENCOUNTER — Encounter (HOSPITAL_COMMUNITY): Payer: Self-pay

## 2017-04-30 DIAGNOSIS — R229 Localized swelling, mass and lump, unspecified: Secondary | ICD-10-CM

## 2017-04-30 NOTE — ED Triage Notes (Signed)
Patient presents to [facility] for boil on left buttock, pt states he was seen about a month ago for boil but now he has pain and would like to be checked out

## 2017-04-30 NOTE — Discharge Instructions (Signed)
You may call the surgical center for an appointment. They may require that you have a primary care provider to make the referral depending on call see and insurance requirements. For now apply heat to the area where the lump is. There is no indication of an abscess at this time.

## 2017-04-30 NOTE — ED Provider Notes (Signed)
[facility]    CSN: [id number] Arrival date & time: 04/30/17  1701     History   Chief Complaint Chief Complaint  Patient presents with  . Recurrent Skin Infections    HPI [name] is a 31 y.o. male.   31 year old male well-known to urgent care emergency Department presents with a nodule in the left buttock adjacent to the anus that he has had off and on for several months. It is recently become tender. He will like to have it addressed. He states that he has not followed up with any of the PCPs or clinics that he has been referred to in his previous visits.      Past Medical History:  Diagnosis Date  . Heart murmur     Patient Active Problem List   Diagnosis Date Noted  . Postop check 06/22/2011  . Appendicitis with perforation 06/07/2011  . Appendicitis with abscess 06/07/2011  . Urinary retention with incomplete bladder emptying 06/07/2011  . Perforated appendicitis 06/06/2011    Past Surgical History:  Procedure Laterality Date  . APPENDECTOMY         Home Medications    Prior to Admission medications   Medication Sig Start Date End Date Taking? Authorizing Provider  dicyclomine (BENTYL) 20 MG tablet Take 1 tablet (20 mg total) by mouth 2 (two) times daily. 04/08/17 04/13/17  Belinda Fisher, PA-C  ibuprofen (ADVIL,MOTRIN) 800 MG tablet Take 1 tablet (800 mg total) by mouth 3 (three) times daily. 12/04/15   Linwood Dibbles, MD  naproxen (NAPROSYN) 500 MG tablet Take 1 tablet (500 mg total) by mouth 2 (two) times daily with a meal. 01/11/17   Oxford, Anselm Pancoast, FNP  ondansetron (ZOFRAN) 4 MG tablet Take 1 tablet (4 mg total) by mouth every 6 (six) hours. 04/08/17   Belinda Fisher, PA-C    Family History History reviewed. No pertinent family history.  Social History Social History  Substance Use Topics  . Smoking status: Current Every Day Smoker    Packs/day: 1.00    Types: Cigarettes  . Smokeless tobacco: Never Used  . Alcohol use Yes     Comment:  several 12 oz beers per day     Allergies   Patient has no known allergies.   Review of Systems Review of Systems  Constitutional: Negative.   Gastrointestinal: Negative for abdominal pain, anal bleeding, blood in stool and rectal pain.  Genitourinary: Negative.  Negative for dysuria, penile pain, penile swelling, scrotal swelling, testicular pain and urgency.  Skin: Negative for wound.       As per history of present illness  All other systems reviewed and are negative.    Physical Exam Triage Vital Signs ED Triage Vitals  Enc Vitals Group     BP 04/30/17 1810 104/60     Pulse Rate 04/30/17 1810 60     Resp 04/30/17 1810 15     Temp 04/30/17 1810 98.1 F (36.7 C)     Temp Source 04/30/17 1810 Oral     SpO2 04/30/17 1810 100 %     Weight --      Height --      Head Circumference --      Peak Flow --      Pain Score 04/30/17 1811 4     Pain Loc --      Pain Edu? --      Excl. in GC? --    No data found.   Updated  Vital Signs BP 104/60 (BP Location: Left Arm)   Pulse 60   Temp 98.1 F (36.7 C) (Oral)   Resp 15   SpO2 100%   Visual Acuity Right Eye Distance:   Left Eye Distance:   Bilateral Distance:    Right Eye Near:   Left Eye Near:    Bilateral Near:     Physical Exam  Constitutional: He is oriented to person, place, and time. He appears well-developed and well-nourished. No distress.  Eyes: EOM are normal.  Neck: Normal range of motion. Neck supple.  Cardiovascular: Normal rate.   Pulmonary/Chest: Effort normal. No respiratory distress.  Genitourinary:  Genitourinary Comments: There is a deep subcutaneous nodule approximately 2 cm in length located in the left buttock adjacent to the anus. Nontender. Nonfluctuant. No discoloration, does not palpate as an abscess.  Musculoskeletal: He exhibits no edema.  Neurological: He is alert and oriented to person, place, and time. He exhibits normal muscle tone.  Skin: Skin is warm and dry.  Psychiatric: He  has a normal mood and affect.  Nursing note and vitals reviewed.    UC Treatments / Results  Labs (all labs ordered are listed, but only abnormal results are displayed) Labs Reviewed - No data to display  EKG  EKG Interpretation None       Radiology No results found.  Procedures Procedures (including critical care time)  Medications Ordered in UC Medications - No data to display   Initial Impression / Assessment and Plan / UC Course  I have reviewed the triage vital signs and the nursing notes.  Pertinent labs & imaging results that were available during my care of the patient were reviewed by me and considered in my medical decision making (see chart for details).    You may call the surgical center for an appointment. They may require that you have a primary care provider to make the referral depending on call see and insurance requirements. For now apply heat to the area where the lump is. There is no indication of an abscess at this time.     Final Clinical Impressions(s) / UC Diagnoses   Final diagnoses:  Subcutaneous nodule    New Prescriptions New Prescriptions   No medications on file     Controlled Substance Prescriptions [place] Controlled Substance Registry consulted? Not Applicable   Aaron Rasmussen, NP 04/30/17 [phone]

## 2017-05-03 ENCOUNTER — Encounter: Payer: Self-pay | Admitting: Surgery

## 2017-05-03 DIAGNOSIS — Z01818 Encounter for other preprocedural examination: Secondary | ICD-10-CM | POA: Diagnosis not present

## 2017-05-03 DIAGNOSIS — Z720 Tobacco use: Secondary | ICD-10-CM | POA: Diagnosis not present

## 2017-05-03 DIAGNOSIS — S52209A Unspecified fracture of shaft of unspecified ulna, initial encounter for closed fracture: Secondary | ICD-10-CM

## 2017-05-03 DIAGNOSIS — K6289 Other specified diseases of anus and rectum: Secondary | ICD-10-CM | POA: Diagnosis not present

## 2017-05-03 HISTORY — DX: Unspecified fracture of shaft of unspecified ulna, initial encounter for closed fracture: S52.209A

## 2017-09-23 ENCOUNTER — Encounter (HOSPITAL_COMMUNITY): Payer: Self-pay | Admitting: Family Medicine

## 2017-09-23 ENCOUNTER — Ambulatory Visit (HOSPITAL_COMMUNITY)
Admission: EM | Admit: 2017-09-23 | Discharge: 2017-09-23 | Disposition: A | Discharge status: Home / Self Care | Payer: 59 | Attending: Family Medicine | Admitting: Family Medicine

## 2017-09-23 DIAGNOSIS — K591 Functional diarrhea: Secondary | ICD-10-CM | POA: Diagnosis not present

## 2017-09-23 NOTE — ED Provider Notes (Signed)
[facility]   [id number] 09/23/17 Arrival Time: 1907   SUBJECTIVE:  [name] is a 32 y.o. male who presents to the urgent care with complaint of alternating constipation with diarrhea.  He works 4 days a week from noon to midnight.   He drinks energy drinks during the day and a six pack of beer at night.  His meals are fast food and snacks.  No blood in stool Lots of gas  Past Medical History:  Diagnosis Date  . Appendicitis with perforation 06/07/2011  . Heart murmur   . Ulnar fracture 05/03/2017  . Urinary retention with incomplete bladder emptying 06/07/2011   History reviewed. No pertinent family history. Social History   Socioeconomic History  . Marital status: Single    Spouse name: Not on file  . Number of children: Not on file  . Years of education: Not on file  . Highest education level: Not on file  Social Needs  . Financial resource strain: Not on file  . Food insecurity - worry: Not on file  . Food insecurity - inability: Not on file  . Transportation needs - medical: Not on file  . Transportation needs - non-medical: Not on file  Occupational History  . Not on file  Tobacco Use  . Smoking status: Current Every Day Smoker    Packs/day: 1.00    Types: Cigarettes  . Smokeless tobacco: Never Used  Substance and Sexual Activity  . Alcohol use: Yes    Comment: several 12 oz beers per day  . Drug use: No    Comment: denies  . Sexual activity: Not on file  Other Topics Concern  . Not on file  Social History Narrative  . Not on file   No outpatient medications have been marked as taking for the 09/23/17 encounter [facility](Hospital Encounter).   No Known Allergies    ROS: As per HPI, remainder of ROS negative.   OBJECTIVE:   Vitals:   09/23/17 1924  BP: 128/88  Pulse: 70  Resp: 18  Temp: 98.6 F (37 C)  SpO2: 98%     General appearance: alert; no distress Eyes: PERRL; EOMI; conjunctiva normal HENT: normocephalic; atraumatic;oral mucosa  normal Neck: supple Lungs: clear to auscultation bilaterally Heart: regular rate and rhythm Abdomen: soft, non-tender; bowel sounds normal; no masses or organomegaly; no guarding or rebound tenderness Back: no CVA tenderness Extremities: no cyanosis or edema; symmetrical with no gross deformities Skin: warm and dry Neurologic: normal gait; grossly normal Psychological: alert and cooperative; normal mood and affect      Labs:  Results for orders placed or performed during the hospital encounter of 01/21/17  Urinalysis, Routine w reflex microscopic- may I&O cath if menses  Result Value Ref Range   Color, Urine YELLOW YELLOW   APPearance CLEAR CLEAR   Specific Gravity, Urine 1.011 1.005 - 1.030   pH 5.0 5.0 - 8.0   Glucose, UA NEGATIVE NEGATIVE mg/dL   Hgb urine dipstick NEGATIVE NEGATIVE   Bilirubin Urine NEGATIVE NEGATIVE   Ketones, ur 5 (A) NEGATIVE mg/dL   Protein, ur NEGATIVE NEGATIVE mg/dL   Nitrite NEGATIVE NEGATIVE   Leukocytes, UA MODERATE (A) NEGATIVE   RBC / HPF 0-5 0 - 5 RBC/hpf   WBC, UA 6-30 0 - 5 WBC/hpf   Bacteria, UA RARE (A) NONE SEEN   Squamous Epithelial / LPF NONE SEEN NONE SEEN   Mucus PRESENT   Basic metabolic panel  Result Value Ref Range   Sodium  139 135 - 145 mmol/L   Potassium 4.2 3.5 - 5.1 mmol/L   Chloride 105 101 - 111 mmol/L   CO2 26 22 - 32 mmol/L   Glucose, Bld 91 65 - 99 mg/dL   BUN 10 6 - 20 mg/dL   Creatinine, Ser 1.61 0.61 - 1.24 mg/dL   Calcium 9.5 8.9 - 09.6 mg/dL   GFR calc non Af Amer >60 >60 mL/min   GFR calc Af Amer >60 >60 mL/min   Anion gap 8 5 - 15  CBC  Result Value Ref Range   WBC 5.6 4.0 - 10.5 K/uL   RBC 4.53 4.22 - 5.81 MIL/uL   Hemoglobin 13.7 13.0 - 17.0 g/dL   HCT 04.5 40.9 - 81.1 %   MCV 89.2 78.0 - 100.0 fL   MCH 30.2 26.0 - 34.0 pg   MCHC 33.9 30.0 - 36.0 g/dL   RDW 91.4 78.2 - 95.6 %   Platelets 224 150 - 400 K/uL    Labs Reviewed - No data to display  No results found.     ASSESSMENT &  PLAN:  No diagnosis found.  No orders of the defined types were placed in this encounter.   Reviewed expectations re: course of current medical issues. Questions answered. Outlined signs and symptoms indicating need for more acute intervention. Patient verbalized understanding. After Visit Summary given.    Procedures:      Elvina Sidle, MD 09/25/17 1140

## 2017-09-23 NOTE — ED Triage Notes (Signed)
Pt reports that for the past few weeks he has been having constipation and diarrhea. sts some days he goes to the bathroom multiple times and sometimes only once and a lot of gas. Denies blood in stool.

## 2017-09-23 NOTE — Discharge Instructions (Signed)
Please discontinue all energy drinks and alcohol for at least a week.  If your symptoms persist, then return and we'll start a thorough evaluation.

## 2017-11-08 ENCOUNTER — Emergency Department (HOSPITAL_COMMUNITY)
Admission: EM | Admit: 2017-11-08 | Discharge: 2017-11-09 | Disposition: A | Discharge status: Home / Self Care | Payer: 59 | Attending: Emergency Medicine | Admitting: Emergency Medicine

## 2017-11-08 ENCOUNTER — Other Ambulatory Visit: Payer: Self-pay

## 2017-11-08 DIAGNOSIS — Z5321 Procedure and treatment not carried out due to patient leaving prior to being seen by health care provider: Secondary | ICD-10-CM | POA: Diagnosis not present

## 2017-11-08 DIAGNOSIS — K5900 Constipation, unspecified: Secondary | ICD-10-CM | POA: Insufficient documentation

## 2017-11-08 DIAGNOSIS — R109 Unspecified abdominal pain: Secondary | ICD-10-CM | POA: Insufficient documentation

## 2017-11-08 NOTE — ED Notes (Signed)
Found in lobby at this time

## 2017-11-09 ENCOUNTER — Encounter (HOSPITAL_COMMUNITY): Payer: Self-pay | Admitting: Emergency Medicine

## 2017-11-09 ENCOUNTER — Other Ambulatory Visit: Payer: Self-pay

## 2017-11-09 LAB — URINALYSIS, ROUTINE W REFLEX MICROSCOPIC
Bacteria, UA: NONE SEEN
Bilirubin Urine: NEGATIVE
GLUCOSE, UA: NEGATIVE mg/dL
HGB URINE DIPSTICK: NEGATIVE
KETONES UR: NEGATIVE mg/dL
Nitrite: NEGATIVE
PH: 8 (ref 5.0–8.0)
Protein, ur: NEGATIVE mg/dL
SQUAMOUS EPITHELIAL / LPF: NONE SEEN
Specific Gravity, Urine: 1.014 (ref 1.005–1.030)

## 2017-11-09 NOTE — ED Triage Notes (Signed)
Pt reports flank pain (relief w/ urination) and constipation.  Pt reports nausea, denies emesis, sob, chest pain

## 2017-11-11 ENCOUNTER — Ambulatory Visit (HOSPITAL_COMMUNITY)
Admission: EM | Admit: 2017-11-11 | Discharge: 2017-11-11 | Disposition: A | Discharge status: Home / Self Care | Payer: 59 | Attending: Physician Assistant | Admitting: Physician Assistant

## 2017-11-11 ENCOUNTER — Encounter (HOSPITAL_COMMUNITY): Payer: Self-pay | Admitting: Emergency Medicine

## 2017-11-11 DIAGNOSIS — M25532 Pain in left wrist: Secondary | ICD-10-CM | POA: Diagnosis not present

## 2017-11-11 DIAGNOSIS — M25531 Pain in right wrist: Secondary | ICD-10-CM

## 2017-11-11 MED ORDER — MELOXICAM 7.5 MG PO TABS: 7.5000 mg | ORAL_TABLET | Freq: Every day | ORAL | 0 refills | Status: DC

## 2017-11-11 NOTE — Discharge Instructions (Signed)
Start Mobic as directed. Ice/heat compresses as needed. This can take up to 3-4 weeks to completely resolve, but you should be feeling better each week. Follow up with PCP/orthopedics for further evaluation needed.

## 2017-11-11 NOTE — ED Triage Notes (Signed)
Pt c/o bilateral wrist, forearm pain for several weeks, states he uses his hands a lot for work.

## 2017-11-11 NOTE — ED Provider Notes (Signed)
[facility]    CSN: [id number] Arrival date & time: 11/11/17  1825     History   Chief Complaint Chief Complaint  Patient presents with  . Wrist Pain    HPI [name] is a 32 y.o. male.   32 year old male comes in for few week history of bilateral upper extremity pain. Pain is intermittent and worse with certain movements/activities. Denies injury/trauma. States pain can be to the forearm, wrist, or hand depending on movement. Also experiences intermittent back pain that is usually flank area when bending over or sitting down. Denies injury/ trauma. Denies urinary symptoms such as frequency, dysuria, hematuria. Denies numbness/tingling, loss of bladder or bowel control. Has not tried anything for the symptoms. States work requires lots of physical activity and repetitive motion.      Past Medical History:  Diagnosis Date  . Appendicitis with perforation 06/07/2011  . Heart murmur   . Ulnar fracture 05/03/2017  . Urinary retention with incomplete bladder emptying 06/07/2011    Patient Active Problem List   Diagnosis Date Noted  . Cyst of perianal area 05/03/2017  . Tobacco abuse 05/03/2017    Past Surgical History:  Procedure Laterality Date  . APPENDECTOMY         Home Medications    Prior to Admission medications   Medication Sig Start Date End Date Taking? Authorizing Provider  meloxicam (MOBIC) 7.5 MG tablet Take 1 tablet (7.5 mg total) by mouth daily. 11/11/17   Belinda FisherYu, Amy V, PA-C    Family History No family history on file.  Social History Social History   Tobacco Use  . Smoking status: Current Every Day Smoker    Packs/day: 1.00    Types: Cigarettes  . Smokeless tobacco: Never Used  Substance Use Topics  . Alcohol use: Yes    Comment: several 12 oz beers per day  . Drug use: No    Types: Marijuana    Comment: denies     Allergies   Patient has no known allergies.   Review of Systems Review of Systems  Reason unable to  perform ROS: See HPI as above.     Physical Exam Triage Vital Signs ED Triage Vitals [11/11/17 1900]  Enc Vitals Group     BP 122/77     Pulse Rate 73     Resp 16     Temp 98.4 F (36.9 C)     Temp src      SpO2 100 %     Weight      Height      Head Circumference      Peak Flow      Pain Score      Pain Loc      Pain Edu?      Excl. in GC?    No data found.  Updated Vital Signs BP 122/77   Pulse 73   Temp 98.4 F (36.9 C)   Resp 16   SpO2 100%   Physical Exam  Constitutional: He is oriented to person, place, and time. He appears well-developed and well-nourished. No distress.  HENT:  Head: Normocephalic and atraumatic.  Eyes: Pupils are equal, round, and reactive to light. Conjunctivae are normal.  Cardiovascular: Normal rate, regular rhythm and normal heart sounds. Exam reveals no gallop and no friction rub.  No murmur heard. Pulmonary/Chest: Effort normal and breath sounds normal. He has no wheezes. He has no rales.  Musculoskeletal:  No tenderness on palpation of  the spinous processes, bilateral back. No tenderness to palpation of bilateral elbow, forearm, wrist, hand. Full range of motion of back, elbow, wrist, fingers. Strength normal and equal bilaterally. Normal grip strength. Patient experiences the pain when doing grip strength. Sensation intact and equal bilaterally.  Radial pulses 2+ and equal bilaterally. Capillary refill less than 2 seconds.   Neurological: He is alert and oriented to person, place, and time.  Skin: Skin is warm and dry.     UC Treatments / Results  Labs (all labs ordered are listed, but only abnormal results are displayed) Labs Reviewed - No data to display  EKG None Radiology No results found.  Procedures Procedures (including critical care time)  Medications Ordered in UC Medications - No data to display   Initial Impression / Assessment and Plan / UC Course  I have reviewed the triage vital signs and the nursing  notes.  Pertinent labs & imaging results that were available during my care of the patient were reviewed by me and considered in my medical decision making (see chart for details).    Start Mobic as directed. Ice compress, rest. Discussed with patient this can take up to 3-4 weeks to resolve, but should be getting better each week. Return precautions given.    Final Clinical Impressions(s) / UC Diagnoses   Final diagnoses:  Pain in both wrists    ED Discharge Orders        Ordered    meloxicam (MOBIC) 7.5 MG tablet  Daily     11/11/17 1925       Belinda Fisher, PA-C 11/11/17 1932

## 2017-12-20 ENCOUNTER — Ambulatory Visit (HOSPITAL_COMMUNITY)
Admission: EM | Admit: 2017-12-20 | Discharge: 2017-12-20 | Disposition: A | Discharge status: Home / Self Care | Payer: 59 | Attending: Family Medicine | Admitting: Family Medicine

## 2017-12-20 ENCOUNTER — Encounter (HOSPITAL_COMMUNITY): Payer: Self-pay | Admitting: Family Medicine

## 2017-12-20 DIAGNOSIS — G514 Facial myokymia: Secondary | ICD-10-CM

## 2017-12-20 LAB — POCT I-STAT, CHEM 8
BUN: 10 mg/dL (ref 6–20)
Calcium, Ion: 1.18 mmol/L (ref 1.15–1.40)
Chloride: 100 mmol/L — ABNORMAL LOW (ref 101–111)
Creatinine, Ser: 0.7 mg/dL (ref 0.61–1.24)
Glucose, Bld: 107 mg/dL — ABNORMAL HIGH (ref 65–99)
HEMATOCRIT: 46 % (ref 39.0–52.0)
HEMOGLOBIN: 15.6 g/dL (ref 13.0–17.0)
POTASSIUM: 4.3 mmol/L (ref 3.5–5.1)
Sodium: 139 mmol/L (ref 135–145)
TCO2: 27 mmol/L (ref 22–32)

## 2017-12-20 NOTE — ED Triage Notes (Signed)
Pt here for muscle twitch under left eye since yesterday. Can visibly see it in triage.

## 2017-12-20 NOTE — Discharge Instructions (Addendum)
Pay attention to any worsening of your facial twitching over the next few days. You may return here or be seen in the Emergency Department if you have any worries. Do your best to ensure adequate sleep and fluid intake. Try to cut down on the amount of caffeine intake also.

## 2017-12-25 ENCOUNTER — Encounter (HOSPITAL_COMMUNITY): Payer: Self-pay | Admitting: Emergency Medicine

## 2017-12-25 ENCOUNTER — Emergency Department (HOSPITAL_COMMUNITY)
Admission: EM | Admit: 2017-12-25 | Discharge: 2017-12-25 | Disposition: A | Discharge status: Home / Self Care | Payer: 59 | Attending: Emergency Medicine | Admitting: Emergency Medicine

## 2017-12-25 DIAGNOSIS — R1310 Dysphagia, unspecified: Secondary | ICD-10-CM | POA: Diagnosis not present

## 2017-12-25 DIAGNOSIS — F17210 Nicotine dependence, cigarettes, uncomplicated: Secondary | ICD-10-CM | POA: Insufficient documentation

## 2017-12-25 MED ORDER — PANTOPRAZOLE SODIUM 20 MG PO TBEC: 20.0000 mg | DELAYED_RELEASE_TABLET | Freq: Every day | ORAL | 0 refills | Status: DC

## 2017-12-25 NOTE — ED Triage Notes (Signed)
Reports noticing trouble swallowing yesterday while drinking an energy drink.  York Spaniel it was better but started back this afternoon.  Drinking water in triage without difficulty.  No swelling noted to throat or mouth.

## 2017-12-25 NOTE — Discharge Instructions (Signed)
Take the Protonix once a day for the next 2 weeks. Try to decrease spicy and acidic foods.  Decrease your tobacco and caffeine use. Follow-up with the ear nose and throat doctor if your symptoms are not improving with this medication, or if your symptoms are happening more frequently. Return to the emergency room if you develop difficulty breathing, inability to swallow, or any new or concerning symptoms.

## 2017-12-25 NOTE — ED Provider Notes (Signed)
[facility] EMERGENCY DEPARTMENT Provider Note   CSN: [id number] Arrival date & time: 12/25/17  2153     History   Chief Complaint Chief Complaint  Patient presents with  . difficulty swallowing    HPI [name] is a 32 y.o. male presenting for evaluation of difficulty swallowing.  Patient states that yesterday evening, he had an episode where he felt like it was difficult to swallow.  He felt like something was stuck in his throat.  This resolved without intervention.  Earlier tonight, he had another similar episode.  At no point did he have any difficulty breathing or speaking.  He states that it feels like he needs to drink slowly, as he cannot swallow big gulps.  He denies history of anything similar.  Denies fevers, chills, ear pain, sore throat, cough, chest pain, shortness of breath.  He reports occasional nausea and epigastric pain after eating.  He denies nighttime cough or history of heartburn.  He has no medical problems, does not take medications daily.  He reports his symptoms have resolved.  When the first episode occurred, patient was smoking cigarettes and drinking an energy drink.  When the second episode occurred, patient was drinking alcohol.  HPI  Past Medical History:  Diagnosis Date  . Appendicitis with perforation 06/07/2011  . Heart murmur   . Ulnar fracture 05/03/2017  . Urinary retention with incomplete bladder emptying 06/07/2011    Patient Active Problem List   Diagnosis Date Noted  . Cyst of perianal area 05/03/2017  . Tobacco abuse 05/03/2017    Past Surgical History:  Procedure Laterality Date  . APPENDECTOMY          Home Medications    Prior to Admission medications   Medication Sig Start Date End Date Taking? Authorizing Provider  pantoprazole (PROTONIX) 20 MG tablet Take 1 tablet (20 mg total) by mouth daily. 12/25/17   Joushua Dugar, PA-C    Family History No family history on file.  Social  History Social History   Tobacco Use  . Smoking status: Current Every Day Smoker    Packs/day: 1.00    Types: Cigarettes  . Smokeless tobacco: Never Used  Substance Use Topics  . Alcohol use: Yes    Comment: several 12 oz beers per day  . Drug use: No    Types: Marijuana    Comment: denies     Allergies   Patient has no known allergies.   Review of Systems Review of Systems  Constitutional: Negative for fever.  HENT: Positive for trouble swallowing. Negative for congestion, dental problem, drooling, ear pain, facial swelling, postnasal drip, rhinorrhea, sinus pressure, sinus pain, sore throat and voice change.        Dysphagia  Eyes: Negative for pain.  Respiratory: Negative for cough and shortness of breath.   Cardiovascular: Negative for chest pain.  Gastrointestinal: Negative for abdominal pain, nausea and vomiting.  Musculoskeletal: Negative for back pain.  Skin: Negative for wound.  Allergic/Immunologic: Negative for immunocompromised state.  Neurological: Negative for headaches.  Hematological: Does not bruise/bleed easily.     Physical Exam Updated Vital Signs BP 116/83 (BP Location: Right Arm)   Pulse 61   Temp 97.9 F (36.6 C) (Oral)   Resp 16   Ht  (1.753 m)   Wt 59 kg (130 lb)   SpO2 100%   BMI 19.20 kg/m   Physical Exam  Constitutional: He is oriented to person, place, and time. He appears well-developed  and well-nourished. No distress.  Sitting comfortably in the chair in no apparent distress.  Drinking from a water bottle without difficulty  HENT:  Head: Normocephalic and atraumatic.  Right Ear: Tympanic membrane, external ear and ear canal normal.  Left Ear: Tympanic membrane, external ear and ear canal normal.  Nose: Nose normal. Right sinus exhibits no maxillary sinus tenderness and no frontal sinus tenderness. Left sinus exhibits no maxillary sinus tenderness and no frontal sinus tenderness.  Mouth/Throat: Uvula is midline, oropharynx  is clear and moist and mucous membranes are normal. No oral lesions. No trismus in the jaw. No dental abscesses, uvula swelling or dental caries. No oropharyngeal exudate, posterior oropharyngeal edema, posterior oropharyngeal erythema or tonsillar abscesses. Tonsils are 0 on the right. Tonsils are 0 on the left. No tonsillar exudate.  No abnormality noted of the oral cavity.  OP clear without tonsillar swelling or exudate.  Uvula midline with equal palate rise.  Handling secretions easily.  Airway intact.  No trismus or muffled voice.  Eyes: Pupils are equal, round, and reactive to light. Conjunctivae and EOM are normal.  Neck: Normal range of motion.  Cardiovascular: Normal rate, regular rhythm and intact distal pulses.  Pulmonary/Chest: Effort normal and breath sounds normal. He has no decreased breath sounds. He has no wheezes. He has no rhonchi. He has no rales.  Pt speaking in full sentences without difficulty.  Clear lung sounds in all fields  Abdominal: Soft. He exhibits no distension and no mass. There is no tenderness. There is no guarding.  Musculoskeletal: Normal range of motion.  Lymphadenopathy:    He has no cervical adenopathy.  Neurological: He is alert and oriented to person, place, and time.  Skin: Skin is warm.  Psychiatric: He has a normal mood and affect.  Nursing note and vitals reviewed.    ED Treatments / Results  Labs (all labs ordered are listed, but only abnormal results are displayed) Labs Reviewed - No data to display  EKG None  Radiology No results found.  Procedures Procedures (including critical care time)  Medications Ordered in ED Medications - No data to display   Initial Impression / Assessment and Plan / ED Course  I have reviewed the triage vital signs and the nursing notes.  Pertinent labs & imaging results that were available during my care of the patient were reviewed by me and considered in my medical decision making (see chart for  details).     Patient presenting for 2 episodes of dysphasia.  Physical exam reassuring, patient in no distress.  Drinking currently from a water bottle without difficulty.  No airway compromise.  Oral exam reassuring, no obvious swelling, erythema, or signs of infection.  As patient also has postprandial nausea and epigastric abdominal pain, ?GERD.  Discussed with patient.  Discussed trial of PPI and reevaluation of symptoms.  Discussed diet, tobacco cessaion, and etoh cessaion. If symptoms are not improving or become more persistent, follow-up with ENT.  Strict return precautions given including difficulty breathing and difficulty speaking.  At this time, patient appears safe for discharge.  Patient states he understands and agrees plan.  Final Clinical Impressions(s) / ED Diagnoses   Final diagnoses:  Dysphagia, unspecified type    ED Discharge Orders        Ordered    pantoprazole (PROTONIX) 20 MG tablet  Daily     12/25/17 2302       Alveria Apley, PA-C 12/26/17 0015    Rolland Porter, MD 12/31/17 1818

## 2018-01-02 NOTE — ED Provider Notes (Signed)
[facility]   [id number] 12/20/17 Arrival Time: 1616  ASSESSMENT & PLAN:  1. Facial twitching    Follow-up Information    [facility]   Specialty:  Urgent Care Why:  If symptoms worsen. Contact information: [address] [place] [address] [phone]         Pay attention to any worsening of your facial twitching over the next few days. You may return here or be seen in the Emergency Department if you have any worries. Do your best to ensure adequate sleep and fluid intake. Try to cut down on the amount of caffeine intake also.  Reassured that I see no signs of a stroke. Reviewed expectations re: course of current medical issues. Questions answered. Outlined signs and symptoms indicating need for more acute intervention. Patient verbalized understanding. After Visit Summary given.   SUBJECTIVE:  [name] is a 32 y.o. male who presents with complaint of 'facial twitching'. For several days. Off and on. Mostly under L eye. No HA/n/v/visual changes. No head injuries. No h/o similar in the past. No new medications. No recent illnesses. Afebrile. Sinus pressure/congestion: no. Extremity weakness: no. Home treatment has included: nothing reported. Current symptoms do not limit normal daily activities. The patient denies dizziness, loss of balance, muscle weakness, numbness of extremities and speech difficulties. No specific aggravating or alleviating factors reported.  ROS: As per HPI.   OBJECTIVE:  Vitals:   12/20/17 1730  BP: (!) 116/92  Pulse: 76  Resp: 18  Temp: 98.6 F (37 C)  SpO2: 100%    General appearance: alert; no distress Eyes: PERRLA; EOMI; conjunctiva normal HENT: normocephalic; atraumatic Neck: supple with FROM Lungs: clear to auscultation bilaterally Heart: regular rate and rhythm Extremities: no edema; symmetrical with no gross deformities Skin: warm and dry Neurologic: CN  2-12 grossly intact; normal gait; normal symmetric reflexes; normal extremity strength and sensation throughout; no facial twitching observed Psychological: alert and cooperative; normal mood and affect  Labs: Results for orders placed or performed during the hospital encounter of 12/20/17  I-STAT, chem 8  Result Value Ref Range   Sodium 139 135 - 145 mmol/L   Potassium 4.3 3.5 - 5.1 mmol/L   Chloride 100 (L) 101 - 111 mmol/L   BUN 10 6 - 20 mg/dL   Creatinine, Ser 2.13 0.61 - 1.24 mg/dL   Glucose, Bld 086 (H) 65 - 99 mg/dL   Calcium, Ion 5.78 4.69 - 1.40 mmol/L   TCO2 27 22 - 32 mmol/L   Hemoglobin 15.6 13.0 - 17.0 g/dL   HCT 62.9 52.8 - 41.3 %   Labs Reviewed  POCT I-STAT, CHEM 8 - Abnormal; Notable for the following components:      Result Value   Chloride 100 (*)    Glucose, Bld 107 (*)    All other components within normal limits    No Known Allergies  Past Medical History:  Diagnosis Date  . Appendicitis with perforation 06/07/2011  . Heart murmur   . Ulnar fracture 05/03/2017  . Urinary retention with incomplete bladder emptying 06/07/2011   Social History   Socioeconomic History  . Marital status: Single    Spouse name: Not on file  . Number of children: Not on file  . Years of education: Not on file  . Highest education level: Not on file  Occupational History  . Not on file  Social Needs  . Financial resource strain: Not on file  .  Food insecurity:    Worry: Not on file    Inability: Not on file  . Transportation needs:    Medical: Not on file    Non-medical: Not on file  Tobacco Use  . Smoking status: Current Every Day Smoker    Packs/day: 1.00    Types: Cigarettes  . Smokeless tobacco: Never Used  Substance and Sexual Activity  . Alcohol use: Yes    Comment: several 12 oz beers per day  . Drug use: No    Types: Marijuana    Comment: denies  . Sexual activity: Not on file  Lifestyle  . Physical activity:    Days per week: Not on file     Minutes per session: Not on file  . Stress: Not on file  Relationships  . Social connections:    Talks on phone: Not on file    Gets together: Not on file    Attends religious service: Not on file    Active member of club or organization: Not on file    Attends meetings of clubs or organizations: Not on file    Relationship status: Not on file  . Intimate partner violence:    Fear of current or ex partner: Not on file    Emotionally abused: Not on file    Physically abused: Not on file    Forced sexual activity: Not on file  Other Topics Concern  . Not on file  Social History Narrative  . Not on file   FH: No h/o stroke reported.  Past Surgical History:  Procedure Laterality Date  . Christy GentlesAPPENDECTOMY       Reinhold Rickey, MD 01/02/18 [phone]

## 2018-02-04 ENCOUNTER — Emergency Department (HOSPITAL_COMMUNITY)
Admission: EM | Admit: 2018-02-04 | Discharge: 2018-02-04 | Disposition: A | Discharge status: Home / Self Care | Payer: 59 | Attending: Emergency Medicine | Admitting: Emergency Medicine

## 2018-02-04 ENCOUNTER — Other Ambulatory Visit: Payer: Self-pay

## 2018-02-04 ENCOUNTER — Encounter (HOSPITAL_COMMUNITY): Payer: Self-pay | Admitting: Emergency Medicine

## 2018-02-04 DIAGNOSIS — R252 Cramp and spasm: Secondary | ICD-10-CM | POA: Diagnosis not present

## 2018-02-04 DIAGNOSIS — F17210 Nicotine dependence, cigarettes, uncomplicated: Secondary | ICD-10-CM | POA: Diagnosis not present

## 2018-02-04 DIAGNOSIS — R197 Diarrhea, unspecified: Secondary | ICD-10-CM | POA: Insufficient documentation

## 2018-02-04 DIAGNOSIS — R55 Syncope and collapse: Secondary | ICD-10-CM | POA: Insufficient documentation

## 2018-02-04 DIAGNOSIS — R1912 Hyperactive bowel sounds: Secondary | ICD-10-CM | POA: Insufficient documentation

## 2018-02-04 DIAGNOSIS — R42 Dizziness and giddiness: Secondary | ICD-10-CM | POA: Diagnosis present

## 2018-02-04 LAB — BASIC METABOLIC PANEL
ANION GAP: 8 (ref 5–15)
BUN: 11 mg/dL (ref 6–20)
CHLORIDE: 101 mmol/L (ref 98–111)
CO2: 28 mmol/L (ref 22–32)
Calcium: 9.2 mg/dL (ref 8.9–10.3)
Creatinine, Ser: 0.82 mg/dL (ref 0.61–1.24)
GFR calc Af Amer: >60 mL/min (ref >60)
Glucose, Bld: 110 mg/dL — ABNORMAL HIGH (ref 70–99)
POTASSIUM: 3.5 mmol/L (ref 3.5–5.1)
Sodium: 137 mmol/L (ref 135–145)

## 2018-02-04 LAB — URINALYSIS, ROUTINE W REFLEX MICROSCOPIC
Bilirubin Urine: NEGATIVE
Glucose, UA: NEGATIVE mg/dL
Hgb urine dipstick: NEGATIVE
Ketones, ur: NEGATIVE mg/dL
LEUKOCYTES UA: NEGATIVE
Nitrite: NEGATIVE
PH: 8 (ref 5.0–8.0)
Protein, ur: NEGATIVE mg/dL
SPECIFIC GRAVITY, URINE: 1.009 (ref 1.005–1.030)

## 2018-02-04 LAB — CBC
HEMATOCRIT: 43 % (ref 39.0–52.0)
HEMOGLOBIN: 14.1 g/dL (ref 13.0–17.0)
MCH: 29.9 pg (ref 26.0–34.0)
MCHC: 32.8 g/dL (ref 30.0–36.0)
MCV: 91.1 fL (ref 78.0–100.0)
Platelets: 245 10*3/uL (ref 150–400)
RBC: 4.72 MIL/uL (ref 4.22–5.81)
RDW: 14.3 % (ref 11.5–15.5)
WBC: 6 10*3/uL (ref 4.0–10.5)

## 2018-02-04 NOTE — ED Triage Notes (Addendum)
Pt c/o dizziness, intermittent diarrhea, L posterior rib pain, "feeling weird" pt reports s/s for over a month. Pt states today he felt flushed. Pt reports drinking 2-3 energy drinks every day. 2.5 today, +smoker and daily etoh

## 2018-02-04 NOTE — ED Notes (Signed)
Patient is alert and orientedx4.  Patient was explained discharge instructions and they understood them with no questions.   

## 2018-02-04 NOTE — ED Provider Notes (Signed)
[facility] EMERGENCY DEPARTMENT Provider Note   CSN: [id number] Arrival date & time: 02/04/18  2002  History   Chief Complaint Chief Complaint  Patient presents with  . Dizziness   HPI Patient is a 32 year old male with no major medical history presenting to the ED for new syncopal episode. Per patient, he had drank 2 and half energy drinks today and didn't eat very much food scheduling. This evening around 1900, he walked outside to smoke a cigarette when he had onset of dizziness. He states he felt like he might pass out. He did not lose consciousness or fall to the ground. No vomiting. No chest pain or dyspnea. On ROS, he also notes multiple chronic complaints including intermittent loose stools, right-sided rib cramping which is not pleuritic, and rumbling of his stomach.  Past Medical History:  Diagnosis Date  . Appendicitis with perforation 06/07/2011  . Heart murmur   . Ulnar fracture 05/03/2017  . Urinary retention with incomplete bladder emptying 06/07/2011    Patient Active Problem List   Diagnosis Date Noted  . Cyst of perianal area 05/03/2017  . Tobacco abuse 05/03/2017    Past Surgical History:  Procedure Laterality Date  . APPENDECTOMY          Home Medications    Prior to Admission medications   Medication Sig Start Date End Date Taking? Authorizing Provider  pantoprazole (PROTONIX) 20 MG tablet Take 1 tablet (20 mg total) by mouth daily. Patient not taking: Reported on 02/04/2018 12/25/17   Caccavale, Jeanette Caprice, PA-C    Family History No family history on file.  Social History Social History   Tobacco Use  . Smoking status: Current Every Day Smoker    Packs/day: 1.00    Types: Cigarettes  . Smokeless tobacco: Never Used  Substance Use Topics  . Alcohol use: Yes    Comment: several 12 oz beers per day  . Drug use: No    Types: Marijuana    Comment: denies     Allergies   Patient has no known allergies.   Review of  Systems Review of Systems  Constitutional: Negative for fever.  HENT: Negative for congestion.   Eyes: Negative for visual disturbance.  Respiratory: Negative for cough and shortness of breath.   Cardiovascular: Negative for chest pain.  Gastrointestinal: Positive for diarrhea. Negative for abdominal pain and vomiting.  Genitourinary: Negative for dysuria.  Musculoskeletal: Negative for back pain.  Skin: Negative for rash.  Neurological: Positive for light-headedness. Negative for headaches.  All other systems reviewed and are negative.    Physical Exam Updated Vital Signs BP (!) 126/95 (BP Location: Right Arm)   Pulse (!) 58   Temp 98.7 F (37.1 C) (Oral)   Resp 16   Ht 5\' 9"  (1.753 m)   Wt 59 kg (130 lb)   SpO2 100%   BMI 19.20 kg/m   Physical Exam  Constitutional: He is oriented to person, place, and time. No distress.  HENT:  Head: Normocephalic and atraumatic.  Mouth/Throat: Oropharynx is clear and moist.  Eyes: Pupils are equal, round, and reactive to light.  Neck: Neck supple. No JVD present.  Cardiovascular: Normal rate, regular rhythm, normal heart sounds and intact distal pulses.  No murmur heard. Pulmonary/Chest: Breath sounds normal. No respiratory distress. He has no wheezes. He has no rales.  Abdominal: Soft. He exhibits no distension and no mass. There is no tenderness. There is no guarding.  Musculoskeletal: Normal range of motion. He exhibits no  edema.  Neurological: He is alert and oriented to person, place, and time.  Skin: Skin is warm and dry.  Psychiatric: He has a normal mood and affect. His behavior is normal.  Nursing note and vitals reviewed.    ED Treatments / Results  Labs (all labs ordered are listed, but only abnormal results are displayed) Labs Reviewed  BASIC METABOLIC PANEL - Abnormal; Notable for the following components:      Result Value   Glucose, Bld 110 (*)    All other components within normal limits  URINALYSIS, ROUTINE  W REFLEX MICROSCOPIC - Abnormal; Notable for the following components:   Color, Urine STRAW (*)    All other components within normal limits  CBC  CBG MONITORING, ED    EKG EKG Interpretation  Date/Time:  Saturday February 04 2018 20:14:05 EDT Ventricular Rate:  77 PR Interval:  190 QRS Duration: 96 QT Interval:  394 QTC Calculation: 445 R Axis:   111 Text Interpretation:  Normal sinus rhythm Right axis deviation Nonspecific T wave abnormality Abnormal ECG Confirmed by Blane OharaZavitz, Joshua [phone]([id number]) on 02/04/2018 9:42:08 PM   Radiology No results found.  Procedures Procedures (including critical care time)  Medications Ordered in ED Medications - No data to display   Initial Impression / Assessment and Plan / ED Course  I have reviewed the triage vital signs and the nursing notes.  Pertinent labs & imaging results that were available during my care of the patient were reviewed by me and considered in my medical decision making (see chart for details).  Patient presenting for episode of near syncope this evening. Likely multifactorial in etiology including poor food intake and alcohol/energy drink use.no red flags for cardiac etiology of syncope. Specifically, no family history of congenital heart disease or sudden cardiac death at young age. EKG shows no signs of Brugada, WPW, HOCM, or ischemia. It does show right axis deviation which has been present on prior EKGs. He is PERC negative. Given chronicity of symptoms and absence of suspicion for PE, I do not feel further PE evaluation warranted. Screening labs are reassuring. I had long discussion patient regarding his alcohol, smoking, dietary habits. I encouraged him to follow up with a  PCP for further preventative care.  Patient informed of all ED findings. Return precautions and follow up plan reviewed. All questions answered.   Final Clinical Impressions(s) / ED Diagnoses   Final diagnoses:  Lightheadedness     Cecille PoMacklin, Cruzita Lipa W,  MD 02/05/18 [id number]    Blane OharaZavitz, Joshua, MD 02/05/18 [phone]

## 2018-03-09 ENCOUNTER — Other Ambulatory Visit: Payer: Self-pay

## 2018-03-09 ENCOUNTER — Ambulatory Visit (HOSPITAL_COMMUNITY)
Admission: EM | Admit: 2018-03-09 | Discharge: 2018-03-09 | Disposition: A | Discharge status: Home / Self Care | Payer: 59 | Attending: Family Medicine | Admitting: Family Medicine

## 2018-03-09 ENCOUNTER — Encounter (HOSPITAL_COMMUNITY): Payer: Self-pay | Admitting: Emergency Medicine

## 2018-03-09 DIAGNOSIS — R195 Other fecal abnormalities: Secondary | ICD-10-CM | POA: Diagnosis not present

## 2018-03-09 DIAGNOSIS — R1013 Epigastric pain: Secondary | ICD-10-CM

## 2018-03-09 NOTE — ED Triage Notes (Signed)
4 stools today, initially soft stool, followed by watery stool.  Same type symptoms 2-3 days ago.  Patient reports abnormal stools for a few months

## 2018-03-09 NOTE — Discharge Instructions (Addendum)
To start, I recommending decreasing your alcohol intake and trying over the counter Zantac 150mg  1-2 times daily. If desired you may call to schedule an appointment with a gastroenterologist.

## 2018-03-15 NOTE — ED Provider Notes (Signed)
[name]   [id number] 03/09/18 Arrival Time: 1754  ASSESSMENT & PLAN:  1. Loose bowel movements   2. Dyspepsia    Unclear etiology. With further questioning, he describes episodes of similar symptoms in the past. Question irritable bowel. Recommended that he might see a gastroenterologist for further evaluation. Symptoms are currently improving. Questions answered.  Will do his best to ensure adequate fluid intake in order to avoid dehydration. Will proceed to the Emergency Department for evaluation if unable to tolerate PO fluids regularly.  Reviewed expectations re: course of current medical issues. Questions answered. Outlined signs and symptoms indicating need for more acute intervention. Patient verbalized understanding. After Visit Summary given.   SUBJECTIVE: History from: patient.  [name] is a 32 y.o. male who presents with complaint of intermittent episodes of loose stools of normal color followed by "watery stools". Past 2-3 days. Bowel movements are non-bloody No n/v. Onset gradual. Abdominal discomfort: mild and cramping. Symptoms are gradually improving since beginning. Aggravating factors: eating. Alleviating factors: none. Associated symptoms: none specific. He denies fever and myalgias. Appetite: normal. PO intake: normal. Ambulatory without assistance. Urinary symptoms: none. Last bowel movement today. OTC treatment: none. No new medications. Does report some stress. Questions relation.   Past Surgical History:  Procedure Laterality Date  . APPENDECTOMY      ROS: As per HPI.  OBJECTIVE:  Vitals:   03/09/18 1855  BP: 122/81  Pulse: 72  Resp: 18  Temp: 98 F (36.7 C)  TempSrc: Oral  SpO2: 100%    General appearance: alert; no distress Oropharynx: moist Lungs: clear to auscultation bilaterally Heart: regular rate and rhythm Abdomen: soft; non-distended; no significant abdominal tenderness, bowel sounds present; no masses or  organomegaly; no guarding or rebound tenderness Back: no CVA tenderness Extremities: no edema; symmetrical with no gross deformities Skin: warm and dry Neurologic: normal gait Psychological: alert and cooperative; normal mood and affect  Labs: Results for orders placed or performed during the hospital encounter of 02/04/18  Basic metabolic panel  Result Value Ref Range   Sodium 137 135 - 145 mmol/L   Potassium 3.5 3.5 - 5.1 mmol/L   Chloride 101 98 - 111 mmol/L   CO2 28 22 - 32 mmol/L   Glucose, Bld 110 (H) 70 - 99 mg/dL   BUN 11 6 - 20 mg/dL   Creatinine, Ser 4.090.82 0.61 - 1.24 mg/dL   Calcium 9.2 8.9 - 81.110.3 mg/dL   GFR calc non Af Amer >60 >60 mL/min   GFR calc Af Amer >60 >60 mL/min   Anion gap 8 5 - 15  CBC  Result Value Ref Range   WBC 6.0 4.0 - 10.5 K/uL   RBC 4.72 4.22 - 5.81 MIL/uL   Hemoglobin 14.1 13.0 - 17.0 g/dL   HCT 91.443.0 78.239.0 - 95.652.0 %   MCV 91.1 78.0 - 100.0 fL   MCH 29.9 26.0 - 34.0 pg   MCHC 32.8 30.0 - 36.0 g/dL   RDW 21.314.3 08.611.5 - 57.815.5 %   Platelets 245 150 - 400 K/uL  Urinalysis, Routine w reflex microscopic  Result Value Ref Range   Color, Urine STRAW (A) YELLOW   APPearance CLEAR CLEAR   Specific Gravity, Urine 1.009 1.005 - 1.030   pH 8.0 5.0 - 8.0   Glucose, UA NEGATIVE NEGATIVE mg/dL   Hgb urine dipstick NEGATIVE NEGATIVE   Bilirubin Urine NEGATIVE NEGATIVE   Ketones, ur NEGATIVE NEGATIVE mg/dL   Protein, ur NEGATIVE NEGATIVE mg/dL  Nitrite NEGATIVE NEGATIVE   Leukocytes, UA NEGATIVE NEGATIVE    No Known Allergies                                             Past Medical History:  Diagnosis Date  . Appendicitis with perforation 06/07/2011  . Heart murmur   . Ulnar fracture 05/03/2017  . Urinary retention with incomplete bladder emptying 06/07/2011   Social History   Socioeconomic History  . Marital status: Single    Spouse name: Not on file  . Number of children: Not on file  . Years of education: Not on file  . Highest education level:  Not on file  Occupational History  . Not on file  Social Needs  . Financial resource strain: Not on file  . Food insecurity:    Worry: Not on file    Inability: Not on file  . Transportation needs:    Medical: Not on file    Non-medical: Not on file  Tobacco Use  . Smoking status: Current Every Day Smoker    Packs/day: 1.00    Types: Cigarettes  . Smokeless tobacco: Never Used  Substance and Sexual Activity  . Alcohol use: Yes    Comment: several 12 oz beers per day  . Drug use: Not Currently    Types: Marijuana    Comment: denies  . Sexual activity: Not on file  Lifestyle  . Physical activity:    Days per week: Not on file    Minutes per session: Not on file  . Stress: Not on file  Relationships  . Social connections:    Talks on phone: Not on file    Gets together: Not on file    Attends religious service: Not on file    Active member of club or organization: Not on file    Attends meetings of clubs or organizations: Not on file    Relationship status: Not on file  . Intimate partner violence:    Fear of current or ex partner: Not on file    Emotionally abused: Not on file    Physically abused: Not on file    Forced sexual activity: Not on file  Other Topics Concern  . Not on file  Social History Narrative  . Not on file   Family History  Problem Relation Age of Onset  . Healthy Mother      Mardella LaymanHagler, Janiene Aarons, MD 03/15/18 [phone]

## 2018-03-19 ENCOUNTER — Encounter (HOSPITAL_COMMUNITY): Payer: Self-pay | Admitting: Emergency Medicine

## 2018-03-19 ENCOUNTER — Emergency Department (HOSPITAL_COMMUNITY): Payer: 59

## 2018-03-19 ENCOUNTER — Emergency Department (HOSPITAL_COMMUNITY)
Admission: EM | Admit: 2018-03-19 | Discharge: 2018-03-19 | Disposition: A | Discharge status: Home / Self Care | Payer: 59 | Attending: Emergency Medicine | Admitting: Emergency Medicine

## 2018-03-19 DIAGNOSIS — R079 Chest pain, unspecified: Secondary | ICD-10-CM | POA: Diagnosis present

## 2018-03-19 DIAGNOSIS — F17210 Nicotine dependence, cigarettes, uncomplicated: Secondary | ICD-10-CM | POA: Insufficient documentation

## 2018-03-19 DIAGNOSIS — R0789 Other chest pain: Secondary | ICD-10-CM | POA: Diagnosis not present

## 2018-03-19 LAB — CBC
HCT: 43.8 % (ref 39.0–52.0)
Hemoglobin: 14.1 g/dL (ref 13.0–17.0)
MCH: 29.7 pg (ref 26.0–34.0)
MCHC: 32.2 g/dL (ref 30.0–36.0)
MCV: 92.4 fL (ref 78.0–100.0)
PLATELETS: 247 10*3/uL (ref 150–400)
RBC: 4.74 MIL/uL (ref 4.22–5.81)
RDW: 14.1 % (ref 11.5–15.5)
WBC: 5.6 10*3/uL (ref 4.0–10.5)

## 2018-03-19 LAB — BASIC METABOLIC PANEL
Anion gap: 10 (ref 5–15)
BUN: 11 mg/dL (ref 6–20)
CALCIUM: 9.5 mg/dL (ref 8.9–10.3)
CO2: 27 mmol/L (ref 22–32)
CREATININE: 0.91 mg/dL (ref 0.61–1.24)
Chloride: 102 mmol/L (ref 98–111)
GFR calc non Af Amer: >60 mL/min (ref >60)
Glucose, Bld: 102 mg/dL — ABNORMAL HIGH (ref 70–99)
Potassium: 4 mmol/L (ref 3.5–5.1)
SODIUM: 139 mmol/L (ref 135–145)

## 2018-03-19 LAB — I-STAT TROPONIN, ED: TROPONIN I, POC: 0 ng/mL (ref 0.00–0.08)

## 2018-03-19 NOTE — ED Provider Notes (Addendum)
[facility]   CSN: [id number] Arrival date & time: 03/19/18  1343     History   Chief Complaint Chief Complaint  Patient presents with  . Chest Pain    HPI [name] is a 32 y.o. male who presents with chest pain. No significant PMH. He states that Thursday he noticed a dull chest pain over the left side of his chest while at work. It is worse with coughing, deep breaths, and left arm movement. 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 He states the pain was not bad Friday and he didn't notice it Saturday. Today when he went back to work the pain came back. He was at work for about 1 hour when he started to feel it when he moved his arm and he became short of breath. This prompted him to come to the ED. He does report that he smokes. No fever, URI symptoms, syncope, current SOB, cough, abdominal pain, N/V. No recent surgery/travel/immobilization, hx of cancer, leg swelling, hemoptysis, prior DVT/PE, or hormone use. He has not tried anything for his symptoms. He states he is anxious about anything with his heart.  HPI  Past Medical History:  Diagnosis Date  . Appendicitis with perforation 06/07/2011  . Heart murmur   . Ulnar fracture 05/03/2017  . Urinary retention with incomplete bladder emptying 06/07/2011    Patient Active Problem List   Diagnosis Date Noted  . Cyst of perianal area 05/03/2017  . Tobacco abuse 05/03/2017    Past Surgical History:  Procedure Laterality Date  . APPENDECTOMY         Home Medications    Prior to Admission medications   Medication Sig Start Date End Date Taking? Authorizing Provider  pantoprazole (PROTONIX) 20 MG tablet Take 1 tablet (20 mg total) by mouth daily. Patient not taking: Reported on 02/04/2018 12/25/17   Alveria Apleyaccavale, Sophia, PA-C    Family History Family History  Problem Relation Age of Onset  . Healthy Mother     Social  History Social History   Tobacco Use  . Smoking status: Current Every Day Smoker    Packs/day: 1.00    Types: Cigarettes  . Smokeless tobacco: Never Used  Substance Use Topics  . Alcohol use: Yes    Comment: several 12 oz beers per day  . Drug use: Not Currently    Types: Marijuana    Comment: denies     Allergies   Patient has no known allergies.   Review of Systems Review of Systems  Constitutional: Negative for chills and fever.  Respiratory: Positive for shortness of breath (resolved). Negative for cough, chest tightness and wheezing.   Cardiovascular: Positive for chest pain. Negative for palpitations and leg swelling.  Gastrointestinal: Negative for abdominal pain, nausea and vomiting.  Neurological: Negative for syncope and light-headedness.  All other systems reviewed and are negative.    Physical Exam Updated Vital Signs BP 133/84 (BP Location: Right Arm)   Pulse (!) 55   Temp 98.6 F (37 C)   Resp 16   Ht 5\' 8"  (1.727 m)   Wt 59 kg   SpO2 100%   BMI 19.77 kg/m   Physical Exam  Constitutional: He is oriented to person, place, and time. He appears well-developed and well-nourished. No distress.  Thin, calm, cooperative  HENT:  Head: Normocephalic and atraumatic.  Eyes: Pupils are equal, round, and reactive to light. Conjunctivae are normal.  Right eye exhibits no discharge. Left eye exhibits no discharge. No scleral icterus.  Neck: Normal range of motion.  Cardiovascular: Normal rate and regular rhythm.  Pulmonary/Chest: Effort normal and breath sounds normal. No stridor. No respiratory distress. He has no wheezes. He has no rales. He exhibits tenderness (mild tenderness over left chest wall and left scapula).  Mild pain is elicited with arm movement  Abdominal: Soft. Bowel sounds are normal. He exhibits no distension.  Neurological: He is alert and oriented to person, place, and time.  Skin: Skin is warm and dry.  Psychiatric: His behavior is normal.  His mood appears anxious.  Nursing note and vitals reviewed.    ED Treatments / Results  Labs (all labs ordered are listed, but only abnormal results are displayed) Labs Reviewed  BASIC METABOLIC PANEL - Abnormal; Notable for the following components:      Result Value   Glucose, Bld 102 (*)    All other components within normal limits  CBC  I-STAT TROPONIN, ED    EKG None  Radiology Dg Chest 2 View  Result Date: 03/19/2018 CLINICAL DATA:  Left-sided chest pain intermittently 2 days. EXAM: CHEST - 2 VIEW COMPARISON:  12/23/2014 FINDINGS: The heart size and mediastinal contours are within normal limits. Both lungs are clear. The visualized skeletal structures are unremarkable. IMPRESSION: No active cardiopulmonary disease. Electronically Signed   By: Elberta Fortisaniel  Boyle M.D.   On: 03/19/2018 14:33    Procedures Procedures (including critical care time)  Medications Ordered in ED Medications - No data to display   Initial Impression / Assessment and Plan / ED Course  I have reviewed the triage vital signs and the nursing notes.  Pertinent labs & imaging results that were available during my care of the patient were reviewed by me and considered in my medical decision making (see chart for details).  32 year old male with intermittent left sided chest pain for the past 3-4 days. Chest pain work up is reassuring. Doubt ACS, PE, pericarditis, esophageal rupture, tension pneumothorax, aortic dissection, cardiac tamponade. Most likely MSK. He also seems somewhat anxious. EKG is NSR. CXR is negative. Initial troponin is 0. Do not feel another troponin is indicated since pain has been intermittent for 4 days and is atypical. Labs are unremarkable. He is PERC negative. Advised tx with NSAIDS, topical meds. Return precautions given.   Final Clinical Impressions(s) / ED Diagnoses   Final diagnoses:  Atypical chest pain    ED Discharge Orders    None           Bethel BornGekas, Carmon Sahli Marie,  PA-C 03/19/18 1839    Bethann BerkshireZammit, Joseph, MD 03/21/18 1135

## 2018-03-19 NOTE — ED Triage Notes (Signed)
Pt arrives for evaluation or left sided chest pain and left shoulder blade pain that has been intermittent since Thursday. Pain is worse with movement and deep breaths. Pain is dull currently, 2/10.

## 2018-03-19 NOTE — Discharge Instructions (Signed)
Please take Tylenol for pain Try topical medicines for muscle pain such as icy hot Please return if you are worsening

## 2018-04-10 ENCOUNTER — Other Ambulatory Visit: Payer: Self-pay | Admitting: Gastroenterology

## 2018-04-10 DIAGNOSIS — R1310 Dysphagia, unspecified: Secondary | ICD-10-CM

## 2018-04-10 DIAGNOSIS — R1319 Other dysphagia: Secondary | ICD-10-CM

## 2018-04-13 ENCOUNTER — Other Ambulatory Visit: Payer: 59

## 2018-04-14 ENCOUNTER — Ambulatory Visit
Admission: RE | Admit: 2018-04-14 | Discharge: 2018-04-14 | Disposition: A | Discharge status: Home / Self Care | Payer: 59 | Source: Ambulatory Visit | Attending: Gastroenterology | Admitting: Gastroenterology

## 2018-04-14 DIAGNOSIS — R1319 Other dysphagia: Secondary | ICD-10-CM

## 2018-04-14 DIAGNOSIS — R1310 Dysphagia, unspecified: Secondary | ICD-10-CM

## 2018-05-14 ENCOUNTER — Emergency Department (HOSPITAL_COMMUNITY)
Admission: EM | Admit: 2018-05-14 | Discharge: 2018-05-15 | Disposition: A | Discharge status: Home / Self Care | Payer: 59 | Attending: Emergency Medicine | Admitting: Emergency Medicine

## 2018-05-14 ENCOUNTER — Encounter (HOSPITAL_COMMUNITY): Payer: Self-pay | Admitting: Emergency Medicine

## 2018-05-14 DIAGNOSIS — F17210 Nicotine dependence, cigarettes, uncomplicated: Secondary | ICD-10-CM | POA: Diagnosis not present

## 2018-05-14 DIAGNOSIS — R109 Unspecified abdominal pain: Secondary | ICD-10-CM | POA: Diagnosis not present

## 2018-05-14 DIAGNOSIS — R197 Diarrhea, unspecified: Secondary | ICD-10-CM | POA: Diagnosis present

## 2018-05-14 NOTE — ED Triage Notes (Signed)
Pt c/o generalized abd pain x 3 days, loose stool x 2 today, minor nausea. Pt states he was given Protonix and Levsin by GI but only has taken Protonix x 1 on Friday.

## 2018-05-15 LAB — URINALYSIS, ROUTINE W REFLEX MICROSCOPIC
BACTERIA UA: NONE SEEN
BILIRUBIN URINE: NEGATIVE
Glucose, UA: NEGATIVE mg/dL
Hgb urine dipstick: NEGATIVE
KETONES UR: NEGATIVE mg/dL
LEUKOCYTES UA: NEGATIVE
NITRITE: NEGATIVE
Protein, ur: NEGATIVE mg/dL
Specific Gravity, Urine: 1.023 (ref 1.005–1.030)
pH: 6 (ref 5.0–8.0)

## 2018-05-15 LAB — CBC
HEMATOCRIT: 41.3 % (ref 39.0–52.0)
Hemoglobin: 13.5 g/dL (ref 13.0–17.0)
MCH: 30.1 pg (ref 26.0–34.0)
MCHC: 32.7 g/dL (ref 30.0–36.0)
MCV: 92.2 fL (ref 80.0–100.0)
NRBC: 0 % (ref 0.0–0.2)
PLATELETS: 245 10*3/uL (ref 150–400)
RBC: 4.48 MIL/uL (ref 4.22–5.81)
RDW: 13.8 % (ref 11.5–15.5)
WBC: 6.4 10*3/uL (ref 4.0–10.5)

## 2018-05-15 LAB — COMPREHENSIVE METABOLIC PANEL
ALK PHOS: 74 U/L (ref 38–126)
ALT: 32 U/L (ref 0–44)
AST: 28 U/L (ref 15–41)
Albumin: 4.2 g/dL (ref 3.5–5.0)
Anion gap: 8 (ref 5–15)
BUN: 10 mg/dL (ref 6–20)
CALCIUM: 9.3 mg/dL (ref 8.9–10.3)
CHLORIDE: 102 mmol/L (ref 98–111)
CO2: 29 mmol/L (ref 22–32)
CREATININE: 0.93 mg/dL (ref 0.61–1.24)
Glucose, Bld: 97 mg/dL (ref 70–99)
Potassium: 3.6 mmol/L (ref 3.5–5.1)
SODIUM: 139 mmol/L (ref 135–145)
Total Bilirubin: 0.5 mg/dL (ref 0.3–1.2)
Total Protein: 6.9 g/dL (ref 6.5–8.1)

## 2018-05-15 LAB — DIFFERENTIAL
Abs Immature Granulocytes: 0.02 10*3/uL (ref 0.00–0.07)
BASOS PCT: 0 %
Basophils Absolute: 0 10*3/uL (ref 0.0–0.1)
EOS ABS: 0.2 10*3/uL (ref 0.0–0.5)
Eosinophils Relative: 3 %
IMMATURE GRANULOCYTES: 0 %
Lymphocytes Relative: 37 %
Lymphs Abs: 2.4 10*3/uL (ref 0.7–4.0)
Monocytes Absolute: 0.6 10*3/uL (ref 0.1–1.0)
Monocytes Relative: 9 %
NEUTROS ABS: 3.2 10*3/uL (ref 1.7–7.7)
NEUTROS PCT: 51 %

## 2018-05-15 LAB — LIPASE, BLOOD: Lipase: 34 U/L (ref 11–51)

## 2018-05-15 MED ORDER — ONDANSETRON 4 MG PO TBDP
4.0000 mg | ORAL_TABLET | Freq: Once | ORAL | Status: AC
  Administered 2018-05-15: 4 mg via ORAL
  Filled 2018-05-15: qty 1

## 2018-05-15 MED ORDER — ONDANSETRON HCL 4 MG/2ML IJ SOLN
4.0000 mg | Freq: Once | INTRAMUSCULAR | Status: DC
  Filled 2018-05-15: qty 2

## 2018-05-15 MED ORDER — DIPHENHYDRAMINE HCL 50 MG/ML IJ SOLN: 25.0000 mg | Freq: Once | INTRAMUSCULAR | Status: DC

## 2018-05-15 MED ORDER — LOPERAMIDE HCL 2 MG PO CAPS
2.0000 mg | ORAL_CAPSULE | Freq: Once | ORAL | Status: AC
  Administered 2018-05-15: 2 mg via ORAL
  Filled 2018-05-15: qty 1

## 2018-05-15 MED ORDER — DICYCLOMINE HCL 20 MG PO TABS: 20.0000 mg | ORAL_TABLET | Freq: Four times a day (QID) | ORAL | 0 refills | Status: DC | PRN

## 2018-05-15 MED ORDER — SODIUM CHLORIDE 0.9 % IV BOLUS: 1000.0000 mL | Freq: Once | INTRAVENOUS | Status: DC

## 2018-05-15 MED ORDER — ONDANSETRON HCL 4 MG PO TABS: 4.0000 mg | ORAL_TABLET | Freq: Four times a day (QID) | ORAL | 0 refills | Status: DC | PRN

## 2018-05-15 MED ORDER — METOCLOPRAMIDE HCL 5 MG/ML IJ SOLN: 10.0000 mg | Freq: Once | INTRAMUSCULAR | Status: DC

## 2018-05-15 NOTE — Discharge Instructions (Signed)
Please take the medication that was prescribed for you by the gastroenterologist.  Take loperamide (Imodium AD) as needed for diarrhea.  Return if symptoms are getting worse.

## 2018-05-15 NOTE — ED Provider Notes (Signed)
[facility] EMERGENCY DEPARTMENT Provider Note   CSN: [id number] Arrival date & time: 05/14/18  2315     History   Chief Complaint Chief Complaint  Patient presents with  . Abdominal Pain    HPI [name] is a 32 y.o. male.  The history is provided by the patient.  He comes in with onset 2 days ago of gurgling in his abdomen, nausea, diarrhea.  He has been having 2 loose to watery bowel movements a day with associated nausea but no vomiting.  He has had subjective fever but no chills or sweats.  There has been some crampy mid abdominal pain which he states has been as severe as 7/10.  Pain is better following passage of flatus or stool.  He has not done anything to treat his symptoms.  Of note, he saw a gastroenterologist about 1 month ago and was prescribed pantoprazole, but he states that he has not been taking it regularly.  He denies any sick contacts.  Past Medical History:  Diagnosis Date  . Appendicitis with perforation 06/07/2011  . Heart murmur   . Ulnar fracture 05/03/2017  . Urinary retention with incomplete bladder emptying 06/07/2011    Patient Active Problem List   Diagnosis Date Noted  . Cyst of perianal area 05/03/2017  . Tobacco abuse 05/03/2017    Past Surgical History:  Procedure Laterality Date  . APPENDECTOMY          Home Medications    Prior to Admission medications   Medication Sig Start Date End Date Taking? Authorizing Provider  pantoprazole (PROTONIX) 20 MG tablet Take 1 tablet (20 mg total) by mouth daily. Patient not taking: Reported on 02/04/2018 12/25/17   Alveria Apley, PA-C    Family History Family History  Problem Relation Age of Onset  . Healthy Mother     Social History Social History   Tobacco Use  . Smoking status: Current Every Day Smoker    Packs/day: 1.00    Types: Cigarettes  . Smokeless tobacco: Never Used  Substance Use Topics  . Alcohol use: Yes    Comment: several 12 oz beers per day   . Drug use: Not Currently    Types: Marijuana    Comment: denies     Allergies   Patient has no known allergies.   Review of Systems Review of Systems  All other systems reviewed and are negative.    Physical Exam Updated Vital Signs BP 122/87 (BP Location: Right Arm)   Pulse 68   Temp 97.7 F (36.5 C) (Oral)   Resp 18   Ht 5\' 9"  (1.753 m)   Wt 68 kg   SpO2 100%   BMI 22.15 kg/m   Physical Exam  Nursing note and vitals reviewed.  32 year old male, resting comfortably and in no acute distress. Vital signs are normal. Oxygen saturation is 100%, which is normal. Head is normocephalic and atraumatic. PERRLA, EOMI. Oropharynx is clear. Neck is nontender and supple without adenopathy or JVD. Back is nontender and there is no CVA tenderness. Lungs are clear without rales, wheezes, or rhonchi. Chest is nontender. Heart has regular rate and rhythm without murmur. Abdomen is soft, flat, nontender without masses or hepatosplenomegaly and peristalsis is normoactive. Extremities have no cyanosis or edema, full range of motion is present. Skin is warm and dry without rash. Neurologic: Mental status is normal, cranial nerves are intact, there are no motor or sensory deficits.  ED Treatments / Results  Labs (all labs ordered are listed, but only abnormal results are displayed) Labs Reviewed  LIPASE, BLOOD  COMPREHENSIVE METABOLIC PANEL  CBC  URINALYSIS, ROUTINE W REFLEX MICROSCOPIC  DIFFERENTIAL   Procedures Procedures   Medications Ordered in ED Medications  loperamide (IMODIUM) capsule 2 mg (2 mg Oral Given 05/15/18 0047)  ondansetron (ZOFRAN-ODT) disintegrating tablet 4 mg (4 mg Oral Given 05/15/18 0122)     Initial Impression / Assessment and Plan / ED Course  I have reviewed the triage vital signs and the nursing notes.  Pertinent lab results that were available during my care of the patient were reviewed by me and considered in my medical decision making  (see chart for details).  Diarrhea with crampy abdominal pain.  Patient's exam is benign, and suspect viral enteritis.  For a low suspicion for diverticulitis.  Will check screening labs and give IV fluids, ondansetron, loperamide.  Old records are reviewed, and it is noted that he was seen on August 8 with complaints of loose bowel movements and question of possible irritable bowel syndrome.  He was referred to gastroenterology at that time.  Labs are unremarkable.  Patient refused IV fluids but took oral fluids and oral ondansetron and states he is feeling better.  Symptoms certainly could be manifestations of irritable bowel syndrome.  He is discharged with prescriptions for dicyclomine and ondansetron that he is referred back to his gastroenterologist for further outpatient work-up and treatment.  Encouraged him to take the medications which had been prescribed for him by his gastroenterologist.  Return precautions discussed.  Final Clinical Impressions(s) / ED Diagnoses   Final diagnoses:  Diarrhea, unspecified type  Abdominal pain, unspecified abdominal location    ED Discharge Orders         Ordered    dicyclomine (BENTYL) 20 MG tablet  4 times daily PRN,   Status:  Discontinued     05/15/18 0215    ondansetron (ZOFRAN) 4 MG tablet  Every 6 hours PRN     05/15/18 0215    dicyclomine (BENTYL) 20 MG tablet  4 times daily PRN     05/15/18 0215           Dione Booze, MD 05/15/18 [phone]

## 2018-05-15 NOTE — ED Notes (Signed)
Pt elects to not have an IV and wants to try po fluids and meds

## 2018-06-05 ENCOUNTER — Encounter (HOSPITAL_COMMUNITY): Payer: Self-pay | Admitting: Emergency Medicine

## 2018-06-05 ENCOUNTER — Ambulatory Visit (HOSPITAL_COMMUNITY)
Admission: EM | Admit: 2018-06-05 | Discharge: 2018-06-05 | Disposition: A | Discharge status: Home / Self Care | Payer: 59 | Attending: Family Medicine | Admitting: Family Medicine

## 2018-06-05 ENCOUNTER — Other Ambulatory Visit: Payer: Self-pay

## 2018-06-05 DIAGNOSIS — R040 Epistaxis: Secondary | ICD-10-CM | POA: Diagnosis not present

## 2018-06-05 DIAGNOSIS — J069 Acute upper respiratory infection, unspecified: Secondary | ICD-10-CM

## 2018-06-05 NOTE — ED Provider Notes (Signed)
[facility]    CSN: [id number] Arrival date & time: 06/05/18  1858     History   Chief Complaint Chief Complaint  Patient presents with  . Epistaxis    HPI [name] is a 32 y.o. male.   32 year old male comes in for epistaxis. States had left nostril nose bleed that has not resolved. States bled for about 15 mins. He has URI symptoms with cough, congestion, rhinorrhea. He has been using over the counter nasal spray that is for "sinus". Denies fever, chills, night sweats. Has not had recurrent episode of nosebleed. Current every day smoker.      Past Medical History:  Diagnosis Date  . Appendicitis with perforation 06/07/2011  . Heart murmur   . Ulnar fracture 05/03/2017  . Urinary retention with incomplete bladder emptying 06/07/2011    Patient Active Problem List   Diagnosis Date Noted  . Cyst of perianal area 05/03/2017  . Tobacco abuse 05/03/2017    Past Surgical History:  Procedure Laterality Date  . APPENDECTOMY         Home Medications    Prior to Admission medications   Medication Sig Start Date End Date Taking? Authorizing Provider  dicyclomine (BENTYL) 20 MG tablet Take 1 tablet (20 mg total) by mouth 4 (four) times daily as needed for spasms (or abdominal cramping). 05/15/18  Yes Dione Booze, MD  hyoscyamine (LEVSIN SL) 0.125 MG SL tablet DISSOLVE 1 TABLET UNDER THE TONGUE TWICE DAILY AS NEEDED FOR 30 DAYS 05/08/18  Yes [provider]  pantoprazole (PROTONIX) 40 MG tablet TAKE 1 TABLET BY MOUTH IN THE MORNING FOR 30 DAYS 05/08/18  Yes [provider]  ondansetron (ZOFRAN) 4 MG tablet Take 1 tablet (4 mg total) by mouth every 6 (six) hours as needed for nausea or vomiting. 05/15/18   Dione Booze, MD    Family History Family History  Problem Relation Age of Onset  . Healthy Mother     Social History Social History   Tobacco Use  . Smoking status: Current Every Day Smoker    Packs/day: 1.00    Types: Cigarettes   . Smokeless tobacco: Never Used  Substance Use Topics  . Alcohol use: Yes    Comment: several 12 oz beers per day  . Drug use: Not Currently    Types: Marijuana    Comment: denies     Allergies   Patient has no known allergies.   Review of Systems Review of Systems  Reason unable to perform ROS: See HPI as above.     Physical Exam Triage Vital Signs ED Triage Vitals  Enc Vitals Group     BP 06/05/18 2004 (!) 122/94     Pulse Rate 06/05/18 2004 62     Resp --      Temp 06/05/18 2004 98.1 F (36.7 C)     Temp Source 06/05/18 2004 Oral     SpO2 06/05/18 2004 100 %     Weight --      Height --      Head Circumference --      Peak Flow --      Pain Score 06/05/18 2005 0     Pain Loc --      Pain Edu? --      Excl. in GC? --    No data found.  Updated Vital Signs BP (!) 122/94 (BP Location: Left Arm)   Pulse 62   Temp 98.1 F (36.7 C) (  Oral)   SpO2 100%   Physical Exam  Constitutional: He is oriented to person, place, and time. He appears well-developed and well-nourished. No distress.  HENT:  Head: Normocephalic and atraumatic.  Right Ear: Tympanic membrane, external ear and ear canal normal. Tympanic membrane is not erythematous and not bulging.  Left Ear: Tympanic membrane, external ear and ear canal normal. Tympanic membrane is not erythematous and not bulging.  Nose: Nose normal. Right sinus exhibits no maxillary sinus tenderness and no frontal sinus tenderness. Left sinus exhibits no maxillary sinus tenderness and no frontal sinus tenderness.  Mouth/Throat: Uvula is midline, oropharynx is clear and moist and mucous membranes are normal.  No epistaxis at this time. No obvious bleed site noted.   Eyes: Pupils are equal, round, and reactive to light. Conjunctivae are normal.  Neck: Normal range of motion. Neck supple.  Cardiovascular: Normal rate, regular rhythm and normal heart sounds. Exam reveals no gallop and no friction rub.  No murmur  heard. Pulmonary/Chest: Effort normal and breath sounds normal. He has no decreased breath sounds. He has no wheezes. He has no rhonchi. He has no rales.  Lymphadenopathy:    He has no cervical adenopathy.  Neurological: He is alert and oriented to person, place, and time.  Skin: Skin is warm and dry.  Psychiatric: He has a normal mood and affect. His behavior is normal. Judgment normal.     UC Treatments / Results  Labs (all labs ordered are listed, but only abnormal results are displayed) Labs Reviewed - No data to display  EKG None  Radiology No results found.  Procedures Procedures (including critical care time)  Medications Ordered in UC Medications - No data to display  Initial Impression / Assessment and Plan / UC Course  I have reviewed the triage vital signs and the nursing notes.  Pertinent labs & imaging results that were available during my care of the patient were reviewed by me and considered in my medical decision making (see chart for details).    Will have patient use saline spray and humidifier to prevent nose bleed. Discussed avoiding blowing nose too hard. Afrin use if nose bleed does not stop after pressure, discussed rebound congestion. Return precautions given. Patient expresses understanding and agrees to plan.  Final Clinical Impressions(s) / UC Diagnoses   Final diagnoses:  Epistaxis  Viral URI    ED Prescriptions    None        Belinda Fisher, PA-C 06/05/18 2040

## 2018-06-05 NOTE — Discharge Instructions (Signed)
Start over the counter nasal saline spray to help prevent nose bleeds. You can also use humidifier. Try to avoid blowing your nose too hard, or coughing too hard. If you cannot get the nose bleed to stop by holding pressure, please get over the counter afrin and use to help with nose bleed, do not use for more than 3 days. Follow up with ENT if continues with frequent nosebleeds.

## 2018-06-05 NOTE — ED Triage Notes (Addendum)
Pt reports a nosebleed today at work at 1800.  He states it lasted about 15 minutes.  Pt has used a nasal spray three times in the last four days he bought from the [employer].

## 2018-06-14 NOTE — Progress Notes (Signed)
Patient ID: [name], male   DOB: 11/23/1985, 32 y.o.   MRN: [id number]      [name], is a 32 y.o. male  BJY:[id number]  [id number]  DOB - 11/10/1985  Subjective:  Chief Complaint and HPI: [name] is a 32 y.o. male here today to establish care and for a follow up visit After being seen in the ED for nose bleed 06/05/2018.  No nose bleed since that visit.  No other bleeding issues.  He also had a cold at the time that precipitated the nose bleed.  The cold is almost gone.    C/o increased BMs sometimes up to 3 times a day.  Has a lot of gas and stomach being noisy and cramping. Ongoing for more than a year.  He has been to the ED multiple times for this and has seen GI.  He was prescribed pantoprazole and hycosamine with some relief.    From UC note 06/05/2018: 32 year old male comes in for epistaxis. States had left nostril nose bleed that has not resolved. States bled for about 15 mins. He has URI symptoms with cough, congestion, rhinorrhea. He has been using over the counter nasal spray that is for "sinus". Denies fever, chills, night sweats. Has not had recurrent episode of nosebleed. Current every day smoker.   A/P: Pertinent labs & imaging results that were available during my care of the patient were reviewed by me and considered in my medical decision making (see chart for details).  Will have patient use saline spray and humidifier to prevent nose bleed. Discussed avoiding blowing nose too hard. Afrin use if nose bleed does not stop after pressure, discussed rebound congestion. Return precautions given. Patient expresses understanding and agrees to plan.  ED/Hospital notes reviewed and summarized above.  ROS:   Constitutional:  No f/c, No night sweats, No unexplained weight loss. EENT:  No vision changes, No blurry vision, No hearing changes. No mouth, throat, or ear problems.  Respiratory: No cough, No SOB Cardiac: No CP, no palpitations GI:  + abd pain, No N/V  +D. GU: No Urinary s/sx Musculoskeletal: No joint pain Neuro: No headache, no dizziness, no motor weakness.  Skin: No rash Endocrine:  No polydipsia. No polyuria.  Psych: Denies SI/HI  No problems updated.  ALLERGIES: No Known Allergies  PAST MEDICAL HISTORY: Past Medical History:  Diagnosis Date  . Appendicitis with perforation 06/07/2011  . Heart murmur   . Ulnar fracture 05/03/2017  . Urinary retention with incomplete bladder emptying 06/07/2011    MEDICATIONS AT HOME: Prior to Admission medications   Medication Sig Start Date End Date Taking? Authorizing Provider  pantoprazole (PROTONIX) 40 MG tablet TAKE 1 TABLET BY MOUTH IN THE MORNING FOR 30 DAYS 05/08/18  Yes [provider]  dicyclomine (BENTYL) 20 MG tablet Take 1 tablet (20 mg total) by mouth 4 (four) times daily as needed for spasms (or abdominal cramping). Patient not taking: Reported on 06/15/2018 05/15/18   Dione BoozeGlick, David, MD  hyoscyamine (LEVSIN SL) 0.125 MG SL tablet DISSOLVE 1 TABLET UNDER THE TONGUE TWICE DAILY AS NEEDED FOR 30 DAYS 05/08/18   [provider]  ondansetron (ZOFRAN) 4 MG tablet Take 1 tablet (4 mg total) by mouth every 6 (six) hours as needed for nausea or vomiting. Patient not taking: Reported on 06/15/2018 05/15/18   Dione BoozeGlick, David, MD     Objective:  EXAM:   Vitals:   06/15/18 1354  BP: 103/69  Pulse: 100  Resp:  18  Temp: 98.3 F (36.8 C)  TempSrc: Oral  SpO2: 100%  Weight: 131 lb (59.4 kg)  Height: 5\' 9"  (1.753 m)    General appearance : A&OX3. NAD. Non-toxic-appearing HEENT: Atraumatic and Normocephalic.  PERRLA. EOM intact.  TM clear B. Mouth-MMM, post pharynx WNL w/o erythema, No PND. Neck: supple, no JVD. No cervical lymphadenopathy. No thyromegaly Chest/Lungs:  Breathing-non-labored, Good air entry bilaterally, breath sounds normal without rales, rhonchi, or wheezing  CVS: S1 S2 regular, no murmurs, gallops, rubs  Abdomen: Bowel sounds present, Non tender and  not distended with no gaurding, rigidity or rebound. Extremities: Bilateral Lower Ext shows no edema, both legs are warm to touch with = pulse throughout Neurology:  CN II-XII grossly intact, Non focal.   Psych:  TP linear. J/I WNL. Normal speech. Appropriate eye contact and affect.  Skin:  No Rash  Data Review No results found for: HGBA1C   Assessment & Plan   1. Dyspepsia Has already seen GI.  Continue f/up there - H. pylori breath test  2. Epistaxis resolved  3. Encounter for examination following treatment at hospital Resolved    Patient have been counseled extensively about nutrition and exercise  Return in about 2 months (around 08/15/2018) for assign PCP.  The patient was given clear instructions to go to ER or return to medical center if symptoms don't improve, worsen or new problems develop. The patient verbalized understanding. The patient was told to call to get lab results if they haven't heard anything in the next week.     Georgian Co, PA-C [facility] [place], [place] [phone]   06/15/2018, 2:17 PM

## 2018-06-15 ENCOUNTER — Ambulatory Visit: Payer: 59 | Attending: Family Medicine | Admitting: Physician Assistant

## 2018-06-15 VITALS — BP 103/69 | HR 100 | Temp 98.3°F | Resp 18 | Ht 69.0 in | Wt 131.0 lb

## 2018-06-15 DIAGNOSIS — R040 Epistaxis: Secondary | ICD-10-CM | POA: Insufficient documentation

## 2018-06-15 DIAGNOSIS — Z09 Encounter for follow-up examination after completed treatment for conditions other than malignant neoplasm: Secondary | ICD-10-CM | POA: Diagnosis not present

## 2018-06-15 DIAGNOSIS — R1013 Epigastric pain: Secondary | ICD-10-CM | POA: Diagnosis not present

## 2018-06-15 DIAGNOSIS — Z79899 Other long term (current) drug therapy: Secondary | ICD-10-CM | POA: Insufficient documentation

## 2018-06-21 ENCOUNTER — Other Ambulatory Visit: Payer: Self-pay | Admitting: Physician Assistant

## 2018-06-21 DIAGNOSIS — R1013 Epigastric pain: Secondary | ICD-10-CM

## 2018-06-21 LAB — H. PYLORI BREATH TEST

## 2018-06-26 ENCOUNTER — Other Ambulatory Visit: Payer: Self-pay | Admitting: Gastroenterology

## 2018-06-26 DIAGNOSIS — R1012 Left upper quadrant pain: Secondary | ICD-10-CM

## 2018-07-04 ENCOUNTER — Other Ambulatory Visit: Payer: Self-pay | Admitting: Gastroenterology

## 2018-07-04 ENCOUNTER — Ambulatory Visit
Admission: RE | Admit: 2018-07-04 | Discharge: 2018-07-04 | Disposition: A | Discharge status: Home / Self Care | Payer: 59 | Source: Ambulatory Visit | Attending: Gastroenterology | Admitting: Gastroenterology

## 2018-07-04 DIAGNOSIS — R1012 Left upper quadrant pain: Secondary | ICD-10-CM

## 2018-07-07 NOTE — Progress Notes (Signed)
Patient was seen by GI and was informed of a different test being completed and he did not need to repeat the breath test.

## 2018-07-21 ENCOUNTER — Ambulatory Visit: Payer: 59 | Attending: Family Medicine | Admitting: Family Medicine

## 2018-07-21 ENCOUNTER — Encounter: Payer: Self-pay | Admitting: Family Medicine

## 2018-07-21 VITALS — BP 114/76 | HR 67 | Temp 97.4°F | Ht 69.0 in | Wt 129.2 lb

## 2018-07-21 DIAGNOSIS — R1013 Epigastric pain: Secondary | ICD-10-CM | POA: Diagnosis not present

## 2018-07-21 DIAGNOSIS — R590 Localized enlarged lymph nodes: Secondary | ICD-10-CM | POA: Diagnosis not present

## 2018-07-21 DIAGNOSIS — F17210 Nicotine dependence, cigarettes, uncomplicated: Secondary | ICD-10-CM | POA: Insufficient documentation

## 2018-07-21 MED ORDER — AMOXICILLIN 500 MG PO CAPS: 500.0000 mg | ORAL_CAPSULE | Freq: Two times a day (BID) | ORAL | 0 refills | Status: AC

## 2018-07-21 NOTE — Progress Notes (Signed)
Subjective:    Patient ID: [name], male    DOB: 05/16/1986, 32 y.o.   MRN: [id number]  HPI       32 year old male who recently established care at this office on 06/15/2018 and was seen by another provider status post emergency department visit for nosebleed.  At his emergency department visit he reported that he had been having cold symptoms and using an over-the-counter nasal spray and had an episode x1 of bleeding from the left nostril that resolved.  Patient was asked to use saline spray and humidifier to help prevent future nosebleeds.  At patient's 06/15/2018 office visit here, patient had complaints of increased bowel movements up to 3 times daily, sensation of abdominal bloating, flatulence and abdominal cramping.  Patient reported the symptoms have been going on for more than a year and patient had been seen several times in the emergency department and has GI follow-up.  Patient was also on pantoprazole and hycosamine with some relief of his symptoms.      At today's visit, patient reports no further nosebleeds since his emergency department visit in November.  Patient denies any current abdominal pain, no current diarrhea or constipation and no current abdominal pain.  Patient states that he is not sure why he is here for today's visit.  Patient states that overall he feels fine.  Patient denies any recent fever chills, no headaches or dizziness, no shortness of breath or cough.  Patient denies any increased muscle or joint pain, no urinary frequency or dysuria.  No sore throat or difficulty swallowing.  Past Medical History:  Diagnosis Date  . Appendicitis with perforation 06/07/2011  . Heart murmur   . Ulnar fracture 05/03/2017  . Urinary retention with incomplete bladder emptying 06/07/2011   Past Surgical History:  Procedure Laterality Date  . APPENDECTOMY     Family History  Problem Relation Age of Onset  . Healthy Mother    Social History   Tobacco Use  . Smoking  status: Current Every Day Smoker    Packs/day: 1.00    Types: Cigarettes  . Smokeless tobacco: Never Used  Substance Use Topics  . Alcohol use: Yes    Comment: several 12 oz beers per day  . Drug use: Not Currently    Types: Marijuana    Comment: denies  .No Known Allergies    Review of Systems  All other systems reviewed and are negative.      Objective:   Physical Exam BP 114/76   Pulse 67   Temp (!) 97.4 F (36.3 C) (Oral)   Ht 5\' 9"  (1.753 m)   Wt 129 lb 3.2 oz (58.6 kg)   SpO2 100%   BMI 19.08 kg/m Nurse's notes and vital signs reviewed General- well-nourished, well-developed but thin adult male in no acute distress ENT- TMs gray, mild edema of the nasal mucosa, mild posterior pharynx erythema Neck-supple, patient with bilateral superior anterior enlarged cervical lymph nodes (larger than a pea but smaller than a grape) which are soft, compressible and nontender and did not feel stuck/adherent Lungs-clear to auscultation bilaterally Cardiovascular-regular rate and rhythm Abdomen-normal bowel sounds, soft and nontender Back-no CVA tenderness Extremities-no edema Psych- slightly flattened affect but otherwise seems to exhibits normal mood and judgment Neuro- cranial nerves II through XII are grossly intact       Assessment & Plan:  1. Dyspepsia On review of the note from patient's last visit, patient had issues with frequent bowel movements, and abdominal cramping  which have been longstanding and patient was being treated with pantoprazole and Levsin with some improvement in symptoms.  At today's visit, patient reports no GI symptoms.  Patient should continue his current medications and continue follow-up with gastroenterology  2. Anterior cervical lymphadenopathy Patient with the presence of anterior cervical chain lymphadenopathy at today's visit.  Patient will be placed on amoxicillin 500 mg twice daily x10 days and patient has been asked to return to clinic in 2  weeks for reevaluation to make sure that the lymphadenopathy has resolved.  Patient was asked to palpate the areas in question while in the office so that he would know what to feel for regarding current enlargement of the cervical lymph nodes.  If patient has fever, chills, loss of appetite/unexplained weight loss, additional lymphadenopathy, excessive fatigue or any concerns, patient should return to clinic sooner. - CBC with Differential - amoxicillin (AMOXIL) 500 MG capsule; Take 1 capsule (500 mg total) by mouth 2 (two) times daily for 10 days.  Dispense: 20 capsule; Refill: 0  An After Visit Summary was printed and given to the patient.  Return in about 2 weeks (around 08/04/2018) for recheck of lymph nodes.

## 2018-07-21 NOTE — Patient Instructions (Signed)
Lymphadenopathy    Lymphadenopathy means that your lymph glands are swollen or larger than normal (enlarged). Lymph glands, also called lymph nodes, are collections of tissue that filter bacteria, viruses, and waste from your bloodstream. They are part of your body's disease-fighting system (immune system), which protects your body from germs.  There may be different causes of lymphadenopathy, depending on where it is in your body. Some types go away on their own. Lymphadenopathy can occur anywhere that you have lymph glands, including these areas:  · Neck (cervical lymphadenopathy).  · Chest (mediastinal lymphadenopathy).  · Lungs (hilar lymphadenopathy).  · Underarms (axillary lymphadenopathy).  · Groin (inguinal lymphadenopathy).  When your immune system responds to germs, infection-fighting cells and fluid build up in your lymph glands. This causes some swelling and enlargement. If the lymph glands do not go back to normal after you have an infection or disease, your health care provider may do tests. These tests help to monitor your condition and find the reason why the glands are still swollen and enlarged.  Follow these instructions at home:  · Get plenty of rest.  · Take over-the-counter and prescription medicines only as told by your health care provider. Your health care provider may recommend over-the-counter medicines for pain.  · If directed, apply heat to swollen lymph glands as often as told by your health care provider. Use the heat source that your health care provider recommends, such as a moist heat pack or a heating pad.  ? Place a towel between your skin and the heat source.  ? Leave the heat on for 20-30 minutes.  ? Remove the heat if your skin turns bright red. This is especially important if you are unable to feel pain, heat, or cold. You may have a greater risk of getting burned.  · Check your affected lymph glands every day for changes. Check other lymph gland areas as told by your health  care provider. Check for changes such as:  ? More swelling.  ? Sudden increase in size.  ? Redness or pain.  ? Hardness.  · Keep all follow-up visits as told by your health care provider. This is important.  Contact a health care provider if you have:  · Swelling that gets worse or spreads to other areas.  · Problems with breathing.  · Lymph glands that:  ? Are still swollen after 2 weeks.  ? Have suddenly gotten bigger.  ? Are red, painful, or hard.  · A fever or chills.  · Fatigue.  · A sore throat.  · Pain in your abdomen.  · Weight loss.  · Night sweats.  Get help right away if you have:  · Fluid leaking from an enlarged lymph gland.  · Severe pain.  · Chest pain.  · Shortness of breath.  Summary  · Lymphadenopathy means that your lymph glands are swollen or larger than normal (enlarged).  · Lymph glands (also called lymph nodes) are collections of tissue that filter bacteria, viruses, and waste from the bloodstream. They are part of your body's disease-fighting system (immune system).  · Lymphadenopathy can occur anywhere that you have lymph glands.  · If your enlarged and swollen lymph glands do not go back to normal after you have an infection or disease, your health care provider may do tests to monitor your condition and find the reason why the glands are still swollen and enlarged.  · Check your affected lymph glands every day for changes. Check other lymph   gland areas as told by your health care provider.  This information is not intended to replace advice given to you by your health care provider. Make sure you discuss any questions you have with your health care provider.  Document Released: 04/27/2008 Document Revised: 06/03/2017 Document Reviewed: 06/03/2017  Elsevier Interactive Patient Education © 2019 [employer]

## 2018-07-22 LAB — CBC WITH DIFFERENTIAL/PLATELET
Basophils Absolute: 0 x10E3/uL (ref 0.0–0.2)
Basos: 1 %
EOS (ABSOLUTE): 0.2 x10E3/uL (ref 0.0–0.4)
Eos: 5 %
Hematocrit: 38.6 % (ref 37.5–51.0)
Hemoglobin: 13.5 g/dL (ref 13.0–17.7)
Immature Grans (Abs): 0 x10E3/uL (ref 0.0–0.1)
Immature Granulocytes: 0 %
Lymphocytes Absolute: 1.9 x10E3/uL (ref 0.7–3.1)
Lymphs: 40 %
MCH: 30.8 pg (ref 26.6–33.0)
MCHC: 35 g/dL (ref 31.5–35.7)
MCV: 88 fL (ref 79–97)
Monocytes Absolute: 0.7 x10E3/uL (ref 0.1–0.9)
Monocytes: 16 %
Neutrophils Absolute: 1.7 x10E3/uL (ref 1.4–7.0)
Neutrophils: 38 %
Platelets: 242 x10E3/uL (ref 150–450)
RBC: 4.39 x10E6/uL (ref 4.14–5.80)
RDW: 13 % (ref 12.3–15.4)
WBC: 4.6 x10E3/uL (ref 3.4–10.8)

## 2018-08-04 ENCOUNTER — Ambulatory Visit: Payer: 59 | Admitting: Family Medicine

## 2018-09-10 ENCOUNTER — Emergency Department (HOSPITAL_COMMUNITY)
Admission: EM | Admit: 2018-09-10 | Discharge: 2018-09-10 | Disposition: A | Discharge status: Home / Self Care | Payer: 59 | Attending: Emergency Medicine | Admitting: Emergency Medicine

## 2018-09-10 ENCOUNTER — Other Ambulatory Visit: Payer: Self-pay

## 2018-09-10 DIAGNOSIS — R197 Diarrhea, unspecified: Secondary | ICD-10-CM | POA: Diagnosis not present

## 2018-09-10 DIAGNOSIS — Z79899 Other long term (current) drug therapy: Secondary | ICD-10-CM | POA: Insufficient documentation

## 2018-09-10 DIAGNOSIS — F17210 Nicotine dependence, cigarettes, uncomplicated: Secondary | ICD-10-CM | POA: Diagnosis not present

## 2018-09-10 DIAGNOSIS — K625 Hemorrhage of anus and rectum: Secondary | ICD-10-CM | POA: Insufficient documentation

## 2018-09-10 DIAGNOSIS — R109 Unspecified abdominal pain: Secondary | ICD-10-CM | POA: Insufficient documentation

## 2018-09-10 LAB — COMPREHENSIVE METABOLIC PANEL
ALK PHOS: 71 U/L (ref 38–126)
ALT: 28 U/L (ref 0–44)
AST: 26 U/L (ref 15–41)
Albumin: 4.3 g/dL (ref 3.5–5.0)
Anion gap: 12 (ref 5–15)
BUN: 13 mg/dL (ref 6–20)
CALCIUM: 9.3 mg/dL (ref 8.9–10.3)
CHLORIDE: 100 mmol/L (ref 98–111)
CO2: 26 mmol/L (ref 22–32)
CREATININE: 1.67 mg/dL — AB (ref 0.61–1.24)
GFR calc Af Amer: >60 mL/min (ref >60)
GFR calc non Af Amer: 54 mL/min — ABNORMAL LOW (ref >60)
Glucose, Bld: 98 mg/dL (ref 70–99)
Potassium: 3.9 mmol/L (ref 3.5–5.1)
Sodium: 138 mmol/L (ref 135–145)
Total Bilirubin: 0.7 mg/dL (ref 0.3–1.2)
Total Protein: 6.8 g/dL (ref 6.5–8.1)

## 2018-09-10 LAB — CBC
HEMATOCRIT: 41.5 % (ref 39.0–52.0)
Hemoglobin: 13.2 g/dL (ref 13.0–17.0)
MCH: 28.9 pg (ref 26.0–34.0)
MCHC: 31.8 g/dL (ref 30.0–36.0)
MCV: 91 fL (ref 80.0–100.0)
Platelets: 214 10*3/uL (ref 150–400)
RBC: 4.56 MIL/uL (ref 4.22–5.81)
RDW: 14.1 % (ref 11.5–15.5)
WBC: 5.9 10*3/uL (ref 4.0–10.5)
nRBC: 0 % (ref 0.0–0.2)

## 2018-09-10 NOTE — ED Provider Notes (Addendum)
[facility] EMERGENCY DEPARTMENT Provider Note   CSN: [id number] Arrival date & time: 09/10/18  0002     History   Chief Complaint Chief Complaint  Patient presents with  . Abdominal Pain    HPI Kevaughn A Dayton is a 33 y.o. male.  Patient presents to the emergency department with a chief complaint of rectal bleeding.  States that he saw some bright red bleeding after having a bowel movement today.  States that he intermittently has abdominal cramps and diarrhea, but this is been ongoing for an extended period of time.  He reports having had CT scans as well as barium swallows performed in the past, but has never had endoscopy or colonoscopy.  He denies any symptoms now, denies having any pain.  Tried taking Bentyl.  The history is provided by the patient. No language interpreter was used.    Past Medical History:  Diagnosis Date  . Appendicitis with perforation 06/07/2011  . Heart murmur   . Ulnar fracture 05/03/2017  . Urinary retention with incomplete bladder emptying 06/07/2011    Patient Active Problem List   Diagnosis Date Noted  . Cyst of perianal area 05/03/2017  . Tobacco abuse 05/03/2017    Past Surgical History:  Procedure Laterality Date  . APPENDECTOMY          Home Medications    Prior to Admission medications   Medication Sig Start Date End Date Taking? Authorizing Provider  hyoscyamine (LEVSIN SL) 0.125 MG SL tablet Take 0.125 mg by mouth every 4 (four) hours as needed for cramping.  05/08/18  Yes [provider]  pantoprazole (PROTONIX) 40 MG tablet Take 40 mg by mouth daily.  05/08/18  Yes [provider]  dicyclomine (BENTYL) 20 MG tablet Take 1 tablet (20 mg total) by mouth 4 (four) times daily as needed for spasms (or abdominal cramping). Patient not taking: Reported on 06/15/2018 05/15/18   Dione Booze, MD  ondansetron (ZOFRAN) 4 MG tablet Take 1 tablet (4 mg total) by mouth every 6 (six) hours as needed for  nausea or vomiting. Patient not taking: Reported on 06/15/2018 05/15/18   Dione Booze, MD    Family History Family History  Problem Relation Age of Onset  . Healthy Mother     Social History Social History   Tobacco Use  . Smoking status: Current Every Day Smoker    Packs/day: 1.00    Types: Cigarettes  . Smokeless tobacco: Never Used  Substance Use Topics  . Alcohol use: Yes    Comment: several 12 oz beers per day  . Drug use: Not Currently    Types: Marijuana    Comment: denies     Allergies   Patient has no known allergies.   Review of Systems Review of Systems  All other systems reviewed and are negative.    Physical Exam Updated Vital Signs BP 126/80 (BP Location: Right Arm)   Pulse 65   Temp (!) 97.5 F (36.4 C) (Oral)   Resp 17   Ht 5\' 9"  (1.753 m)   Wt 61.2 kg   SpO2 99%   BMI 19.94 kg/m   Physical Exam Vitals signs and nursing note reviewed.  Constitutional:      Appearance: He is well-developed.  HENT:     Head: Normocephalic and atraumatic.  Eyes:     General: No scleral icterus.       Right eye: No discharge.        Left eye: No  discharge.     Conjunctiva/sclera: Conjunctivae normal.     Pupils: Pupils are equal, round, and reactive to light.  Neck:     Musculoskeletal: Normal range of motion and neck supple.     Vascular: No JVD.  Cardiovascular:     Rate and Rhythm: Normal rate and regular rhythm.     Heart sounds: Normal heart sounds. No murmur. No friction rub. No gallop.   Pulmonary:     Effort: Pulmonary effort is normal. No respiratory distress.     Breath sounds: Normal breath sounds. No wheezing or rales.  Chest:     Chest wall: No tenderness.  Abdominal:     General: There is no distension.     Palpations: Abdomen is soft. There is no mass.     Tenderness: There is no abdominal tenderness. There is no guarding or rebound.     Comments: No focal abdominal tenderness, no RLQ tenderness or pain at McBurney's point, no  RUQ tenderness or Murphy's sign, no left-sided abdominal tenderness, no fluid wave, or signs of peritonitis   Genitourinary:    Comments: Chaperone present for exam, no external hemorrhoids or fissures Musculoskeletal: Normal range of motion.        General: No tenderness.  Skin:    General: Skin is warm and dry.  Neurological:     Mental Status: He is alert and oriented to person, place, and time.  Psychiatric:        Behavior: Behavior normal.        Thought Content: Thought content normal.        Judgment: Judgment normal.      ED Treatments / Results  Labs (all labs ordered are listed, but only abnormal results are displayed) Labs Reviewed  COMPREHENSIVE METABOLIC PANEL - Abnormal; Notable for the following components:      Result Value   Creatinine, Ser 1.67 (*)    GFR calc non Af Amer 54 (*)    All other components within normal limits  CBC    EKG None  Radiology No results found.  Procedures Procedures (including critical care time)  Medications Ordered in ED Medications - No data to display   Initial Impression / Assessment and Plan / ED Course  I have reviewed the triage vital signs and the nursing notes.  Pertinent labs & imaging results that were available during my care of the patient were reviewed by me and considered in my medical decision making (see chart for details).     Patient with rectal bleeding.  Has had intermittent cramps and diarrhea for and extended period of time.  Would likely benefit from colonoscopy.  Has been referred back to GI.  Abdomen is soft and non-tender.  Doubt surgical or acute abdomen.  Patient does have mildly elevated Cr.  I discussed this with him and have advised him to stop drinking energy drinks and drink more water.  I have advised him to have his Cr rechecked by his doctor in 1 week.  Final Clinical Impressions(s) / ED Diagnoses   Final diagnoses:  Rectal bleeding    ED Discharge Orders    None           Roxy Horseman, PA-C 09/10/18 0327    Dione Booze, MD 09/10/18 0500

## 2018-09-10 NOTE — ED Triage Notes (Signed)
Patient c/o abd pain that began today. Denies constipation. States that there is bright red blood in his toilet after a BP.

## 2018-09-13 ENCOUNTER — Inpatient Hospital Stay: Payer: 59 | Admitting: Critical Care Medicine

## 2018-10-03 ENCOUNTER — Inpatient Hospital Stay: Payer: 59 | Admitting: Critical Care Medicine

## 2018-10-03 NOTE — Progress Notes (Deleted)
   Subjective:    Patient ID: [name], male    DOB: 1986-03-16, 33 y.o.   MRN: [id number]  33 y.o.M  Recent ED visit for rectal bleeding  Patient with rectal bleeding.  Has had intermittent cramps and diarrhea for and extended period of time.  Would likely benefit from colonoscopy.  Has been referred back to GI.  Abdomen is soft and non-tender.  Doubt surgical or acute abdomen.  Patient does have mildly elevated Cr.  I discussed this with him and have advised him to stop drinking energy drinks and drink more water.  I have advised him to have his Cr rechecked by his doctor in 1 week.  Gaps Tdap, Flu, HIV      Review of Systems     Objective:   Physical Exam        Assessment & Plan:

## 2018-10-04 ENCOUNTER — Telehealth: Payer: Self-pay | Admitting: *Deleted

## 2018-10-04 NOTE — Telephone Encounter (Signed)
Patient no showed for their most recent appointment 10/03/2018. Patient forgot about the appointment. MA attempted to reschedule and patient disconnected.

## 2018-10-26 ENCOUNTER — Emergency Department (HOSPITAL_COMMUNITY)
Admission: EM | Admit: 2018-10-26 | Discharge: 2018-10-27 | Disposition: A | Discharge status: Home / Self Care | Payer: 59 | Attending: Emergency Medicine | Admitting: Emergency Medicine

## 2018-10-26 ENCOUNTER — Encounter (HOSPITAL_COMMUNITY): Payer: Self-pay | Admitting: Emergency Medicine

## 2018-10-26 ENCOUNTER — Other Ambulatory Visit: Payer: Self-pay

## 2018-10-26 DIAGNOSIS — R10816 Epigastric abdominal tenderness: Secondary | ICD-10-CM | POA: Diagnosis not present

## 2018-10-26 DIAGNOSIS — F17210 Nicotine dependence, cigarettes, uncomplicated: Secondary | ICD-10-CM | POA: Insufficient documentation

## 2018-10-26 DIAGNOSIS — R1084 Generalized abdominal pain: Secondary | ICD-10-CM | POA: Diagnosis not present

## 2018-10-26 DIAGNOSIS — Z79899 Other long term (current) drug therapy: Secondary | ICD-10-CM | POA: Diagnosis not present

## 2018-10-26 DIAGNOSIS — R10815 Periumbilic abdominal tenderness: Secondary | ICD-10-CM | POA: Insufficient documentation

## 2018-10-26 DIAGNOSIS — K922 Gastrointestinal hemorrhage, unspecified: Secondary | ICD-10-CM | POA: Diagnosis not present

## 2018-10-26 DIAGNOSIS — K921 Melena: Secondary | ICD-10-CM | POA: Diagnosis present

## 2018-10-26 LAB — URINALYSIS, ROUTINE W REFLEX MICROSCOPIC
Bilirubin Urine: NEGATIVE
Glucose, UA: NEGATIVE mg/dL
Hgb urine dipstick: NEGATIVE
Ketones, ur: NEGATIVE mg/dL
Leukocytes,Ua: NEGATIVE
Nitrite: NEGATIVE
Protein, ur: NEGATIVE mg/dL
SPECIFIC GRAVITY, URINE: 1.004 — AB (ref 1.005–1.030)
pH: 7 (ref 5.0–8.0)

## 2018-10-26 MED ORDER — SODIUM CHLORIDE 0.9% FLUSH: 3.0000 mL | Freq: Once | INTRAVENOUS | Status: DC

## 2018-10-26 NOTE — ED Triage Notes (Signed)
Patient with abdominal pain that is chronic in nature.  Patient states that he has a GI MD that has not found out what his problem is at this time.  He states that he was straining while having a BM and saw blood in his stool.

## 2018-10-27 LAB — COMPREHENSIVE METABOLIC PANEL
ALBUMIN: 4.8 g/dL (ref 3.5–5.0)
ALT: 34 U/L (ref 0–44)
AST: 31 U/L (ref 15–41)
Alkaline Phosphatase: 79 U/L (ref 38–126)
Anion gap: 13 (ref 5–15)
BUN: 9 mg/dL (ref 6–20)
CHLORIDE: 98 mmol/L (ref 98–111)
CO2: 23 mmol/L (ref 22–32)
Calcium: 9.6 mg/dL (ref 8.9–10.3)
Creatinine, Ser: 0.71 mg/dL (ref 0.61–1.24)
GFR calc Af Amer: >60 mL/min (ref >60)
GFR calc non Af Amer: >60 mL/min (ref >60)
Glucose, Bld: 90 mg/dL (ref 70–99)
Potassium: 3.9 mmol/L (ref 3.5–5.1)
Sodium: 134 mmol/L — ABNORMAL LOW (ref 135–145)
Total Bilirubin: 0.8 mg/dL (ref 0.3–1.2)
Total Protein: 7.6 g/dL (ref 6.5–8.1)

## 2018-10-27 LAB — CBC
HEMATOCRIT: 44.2 % (ref 39.0–52.0)
Hemoglobin: 14.3 g/dL (ref 13.0–17.0)
MCH: 30 pg (ref 26.0–34.0)
MCHC: 32.4 g/dL (ref 30.0–36.0)
MCV: 92.7 fL (ref 80.0–100.0)
Platelets: 225 10*3/uL (ref 150–400)
RBC: 4.77 MIL/uL (ref 4.22–5.81)
RDW: 14.1 % (ref 11.5–15.5)
WBC: 4.8 10*3/uL (ref 4.0–10.5)
nRBC: 0 % (ref 0.0–0.2)

## 2018-10-27 LAB — LIPASE, BLOOD: Lipase: 27 U/L (ref 11–51)

## 2018-10-27 MED ORDER — PANTOPRAZOLE SODIUM 40 MG PO TBEC: 40.0000 mg | DELAYED_RELEASE_TABLET | Freq: Every day | ORAL | 0 refills | Status: AC

## 2018-10-27 MED ORDER — SUCRALFATE 1 G PO TABS
1.0000 g | ORAL_TABLET | Freq: Once | ORAL | Status: AC
  Administered 2018-10-27: 1 g via ORAL
  Filled 2018-10-27: qty 1

## 2018-10-27 NOTE — Discharge Instructions (Signed)
Please take the medications prescribed. Refrain from heavy alcohol use.   The symptoms could be simply because of a viral infection or it could be due to conditions like stomach ulcer or inflammatory bowel disease. See the GI doctor as recommended.  Return to the emergency room if you start having severe pain, severe bleeding, high fevers, bloody vomit.

## 2018-10-27 NOTE — ED Provider Notes (Signed)
[facility] EMERGENCY DEPARTMENT Provider Note   CSN: [id number] Arrival date & time: 10/26/18  2300    History   Chief Complaint Chief Complaint  Patient presents with  . Abdominal Pain    HPI [name] is a 33 y.o. male.     HPI  33 year old male comes in a chief complaint of bloody stools. Patient reports that his been feeling unwell for the last 3 to 5 days.  Initially he was not having bowel movement, but then 3 days ago he started having abdominal discomfort followed by intermittent episodes of blood in his stools.  The blood was bright red in color.  Yesterday he had 2 stools with small blood in it.  Starting today however he has had clear liquid stools.  Patient continues to have intermittent abdominal discomfort that is periumbilical and epigastric in nature.  He reports that he had bloody stools in the past.  He was seen in the urgent care, ER and also by GI doctor.  He was told that he might have anal fissures.  The GI doctor start him on Protonix after negative CT scan of his abdomen.  Social history is positive for daily drinking.  Patient drinks about 4 x 40 ounce beers regularly.  He denies any family history of inflammatory bowel disease, rash, polyarthralgia, weight loss, sweats.  Past Medical History:  Diagnosis Date  . Appendicitis with perforation 06/07/2011  . Heart murmur   . Ulnar fracture 05/03/2017  . Urinary retention with incomplete bladder emptying 06/07/2011    Patient Active Problem List   Diagnosis Date Noted  . Cyst of perianal area 05/03/2017  . Tobacco abuse 05/03/2017    Past Surgical History:  Procedure Laterality Date  . APPENDECTOMY          Home Medications    Prior to Admission medications   Medication Sig Start Date End Date Taking? Authorizing Provider  dicyclomine (BENTYL) 20 MG tablet Take 1 tablet (20 mg total) by mouth 4 (four) times daily as needed for spasms (or abdominal cramping). Patient not  taking: Reported on 06/15/2018 05/15/18   Dione Booze, MD  hyoscyamine (LEVSIN SL) 0.125 MG SL tablet Take 0.125 mg by mouth every 4 (four) hours as needed for cramping.  05/08/18   [provider]  ondansetron (ZOFRAN) 4 MG tablet Take 1 tablet (4 mg total) by mouth every 6 (six) hours as needed for nausea or vomiting. Patient not taking: Reported on 06/15/2018 05/15/18   Dione Booze, MD  pantoprazole (PROTONIX) 40 MG tablet Take 1 tablet (40 mg total) by mouth daily. 10/27/18   Derwood Kaplan, MD    Family History Family History  Problem Relation Age of Onset  . Healthy Mother     Social History Social History   Tobacco Use  . Smoking status: Current Every Day Smoker    Packs/day: 1.00    Types: Cigarettes  . Smokeless tobacco: Never Used  Substance Use Topics  . Alcohol use: Yes    Comment: several 12 oz beers per day  . Drug use: Not Currently    Types: Marijuana    Comment: denies     Allergies   Patient has no known allergies.   Review of Systems Review of Systems  Constitutional: Positive for activity change. Negative for fatigue and fever.  Gastrointestinal: Positive for abdominal pain and anal bleeding. Negative for vomiting.  Allergic/Immunologic: Negative for immunocompromised state.  Hematological: Does not bruise/bleed easily.  All other  systems reviewed and are negative.    Physical Exam Updated Vital Signs BP (!) 120/102   Pulse 65   Temp 98.2 F (36.8 C) (Oral)   Resp 16   SpO2 100%   Physical Exam Vitals signs and nursing note reviewed.  Constitutional:      Appearance: He is well-developed.  HENT:     Head: Atraumatic.  Neck:     Musculoskeletal: Neck supple.  Cardiovascular:     Rate and Rhythm: Normal rate.  Pulmonary:     Effort: Pulmonary effort is normal.  Abdominal:     Tenderness: There is generalized abdominal tenderness and tenderness in the epigastric area and periumbilical area. There is no guarding or rebound.      Comments: Patient has no anal fissures, external hemorrhoids  Skin:    General: Skin is warm.  Neurological:     Mental Status: He is alert and oriented to person, place, and time.      ED Treatments / Results  Labs (all labs ordered are listed, but only abnormal results are displayed) Labs Reviewed  COMPREHENSIVE METABOLIC PANEL - Abnormal; Notable for the following components:      Result Value   Sodium 134 (*)    All other components within normal limits  URINALYSIS, ROUTINE W REFLEX MICROSCOPIC - Abnormal; Notable for the following components:   Color, Urine COLORLESS (*)    Specific Gravity, Urine 1.004 (*)    All other components within normal limits  LIPASE, BLOOD  CBC    EKG None  Radiology No results found.  Procedures Procedures (including critical care time)  Medications Ordered in ED Medications  sodium chloride flush (NS) 0.9 % injection 3 mL (has no administration in time range)  sucralfate (CARAFATE) tablet 1 g (has no administration in time range)     Initial Impression / Assessment and Plan / ED Course  I have reviewed the triage vital signs and the nursing notes.  Pertinent labs & imaging results that were available during my care of the patient were reviewed by me and considered in my medical decision making (see chart for details).        DDx includes: Esophagitis Mallory Weiss tear [place]  Variceal bleeding PUD/./ulcers Diverticular bleed Colon cancer Rectal bleed Internal hemorrhoids External hemorrhoids  33 year old male comes in a chief complaint of abdominal pain along with bloody stools.  His abdominal pain is described as intermittent and cramping in nature.  He has had loose and watery bowel movements today, however reports that he had bloody stools yesterday.  His symptoms could be because of a viral infection.  He has no prodrome consistent with inflammatory bowel disease and family history is negative for the same.   Patient does indicate that he has been drinking 6 beers every night and in the past was told that he might have gastritis.  Although suspicion is high for lower GI source for the bleeding, the pain is periumbilical and epigastric and with this history of heavy drinking and prior GI evaluation where he was started on Protonix, we will restart him on Protonix and have him follow-up with GI.  Return precautions for ED visit have been discussed.  Final Clinical Impressions(s) / ED Diagnoses   Final diagnoses:  Acute lower GI bleeding  Generalized abdominal pain    ED Discharge Orders         Ordered    pantoprazole (PROTONIX) 40 MG tablet  Daily     10/27/18 0113  Derwood Kaplan, MD 10/27/18 0120

## 2018-12-08 ENCOUNTER — Ambulatory Visit (HOSPITAL_COMMUNITY)
Admission: EM | Admit: 2018-12-08 | Discharge: 2018-12-08 | Disposition: A | Discharge status: Home / Self Care | Payer: 59 | Attending: Physician Assistant | Admitting: Physician Assistant

## 2018-12-08 ENCOUNTER — Ambulatory Visit (INDEPENDENT_AMBULATORY_CARE_PROVIDER_SITE_OTHER): Payer: 59

## 2018-12-08 ENCOUNTER — Other Ambulatory Visit: Payer: Self-pay

## 2018-12-08 DIAGNOSIS — R0602 Shortness of breath: Secondary | ICD-10-CM | POA: Diagnosis not present

## 2018-12-08 DIAGNOSIS — R0789 Other chest pain: Secondary | ICD-10-CM | POA: Diagnosis not present

## 2018-12-08 MED ORDER — ALBUTEROL SULFATE HFA 108 (90 BASE) MCG/ACT IN AERS: 1.0000 | INHALATION_SPRAY | Freq: Four times a day (QID) | RESPIRATORY_TRACT | 0 refills | Status: AC | PRN

## 2018-12-08 MED ORDER — ALBUTEROL SULFATE HFA 108 (90 BASE) MCG/ACT IN AERS: 1.0000 | INHALATION_SPRAY | Freq: Four times a day (QID) | RESPIRATORY_TRACT | 0 refills | Status: DC | PRN

## 2018-12-08 MED ORDER — METHOCARBAMOL 500 MG PO TABS: 500.0000 mg | ORAL_TABLET | Freq: Two times a day (BID) | ORAL | 0 refills | Status: DC

## 2018-12-08 NOTE — ED Triage Notes (Signed)
Per  Pt he was at home today about 3pm and started having chest tightness and SOB with some dizziness. Pt said he had some chest discomfort. No nausea, vomiting pain does not moves anywhere. Pt  Said it was difficult for him to swallow and mouth was dry today. No distress noted at this time

## 2018-12-08 NOTE — ED Provider Notes (Signed)
[facility]    CSN: [id number] Arrival date & time: 12/08/18  1807     History   Chief Complaint Chief Complaint  Patient presents with  . Shortness of Breath  . Chest Pain    HPI [name] is a 33 y.o. male.   The history is provided by the patient. No language interpreter was used.  Chest Pain  Pain location:  Substernal area Pain quality: aching   Pain radiates to:  Does not radiate Pain severity:  Moderate Onset quality:  Gradual Duration:  1 day Timing:  Constant Progression:  Worsening Chronicity:  New Context: lifting   Context: not breathing   Relieved by:  Nothing Worsened by:  Nothing Ineffective treatments:  Antacids Associated symptoms: abdominal pain   Pt has chronic gi issues.  Pt reports today he was lifting in the yard and had pain in the center of his chest  Past Medical History:  Diagnosis Date  . Appendicitis with perforation 06/07/2011  . Heart murmur   . Ulnar fracture 05/03/2017  . Urinary retention with incomplete bladder emptying 06/07/2011    Patient Active Problem List   Diagnosis Date Noted  . Cyst of perianal area 05/03/2017  . Tobacco abuse 05/03/2017    Past Surgical History:  Procedure Laterality Date  . APPENDECTOMY         Home Medications    Prior to Admission medications   Medication Sig Start Date End Date Taking? Authorizing Provider  dicyclomine (BENTYL) 20 MG tablet Take 1 tablet (20 mg total) by mouth 4 (four) times daily as needed for spasms (or abdominal cramping). Patient not taking: Reported on 06/15/2018 05/15/18   Dione Booze, MD  hyoscyamine (LEVSIN SL) 0.125 MG SL tablet Take 0.125 mg by mouth every 4 (four) hours as needed for cramping.  05/08/18   [provider]  ondansetron (ZOFRAN) 4 MG tablet Take 1 tablet (4 mg total) by mouth every 6 (six) hours as needed for nausea or vomiting. Patient not taking: Reported on 06/15/2018 05/15/18   Dione Booze, MD  pantoprazole (PROTONIX)  40 MG tablet Take 1 tablet (40 mg total) by mouth daily. 10/27/18   Derwood Kaplan, MD    Family History Family History  Problem Relation Age of Onset  . Healthy Mother     Social History Social History   Tobacco Use  . Smoking status: Current Every Day Smoker    Packs/day: 1.00    Types: Cigarettes  . Smokeless tobacco: Never Used  Substance Use Topics  . Alcohol use: Yes    Comment: several 12 oz beers per day  . Drug use: Not Currently    Types: Marijuana    Comment: denies     Allergies   Patient has no known allergies.   Review of Systems Review of Systems  Cardiovascular: Positive for chest pain.  Gastrointestinal: Positive for abdominal pain.  All other systems reviewed and are negative.    Physical Exam Triage Vital Signs ED Triage Vitals  Enc Vitals Group     BP 12/08/18 1823 132/82     Pulse Rate 12/08/18 1823 62     Resp 12/08/18 1823 18     Temp 12/08/18 1823 98 F (36.7 C)     Temp Source 12/08/18 1823 Oral     SpO2 12/08/18 1823 98 %     Weight --      Height --      Head Circumference --  Peak Flow --      Pain Score 12/08/18 1822 5     Pain Loc --      Pain Edu? --      Excl. in GC? --    No data found.  Updated Vital Signs BP 132/82 (BP Location: Right Arm)   Pulse 62   Temp 98 F (36.7 C) (Oral)   Resp 18   SpO2 98%   Visual Acuity Right Eye Distance:   Left Eye Distance:   Bilateral Distance:    Right Eye Near:   Left Eye Near:    Bilateral Near:     Physical Exam Vitals signs and nursing note reviewed.  Constitutional:      Appearance: He is well-developed.  HENT:     Head: Normocephalic and atraumatic.  Eyes:     Conjunctiva/sclera: Conjunctivae normal.  Neck:     Musculoskeletal: Neck supple.  Cardiovascular:     Rate and Rhythm: Normal rate and regular rhythm.     Heart sounds: No murmur.  Pulmonary:     Effort: Pulmonary effort is normal. No respiratory distress.     Breath sounds: Decreased breath  sounds present.  Chest:     Chest wall: Mass present.  Abdominal:     Palpations: Abdomen is soft.     Tenderness: There is no abdominal tenderness.  Skin:    General: Skin is warm and dry.  Neurological:     General: No focal deficit present.     Mental Status: He is alert.  Psychiatric:        Mood and Affect: Mood normal.      UC Treatments / Results  Labs (all labs ordered are listed, but only abnormal results are displayed) Labs Reviewed - No data to display  EKG None  Radiology Dg Chest 2 View  Result Date: 12/08/2018 CLINICAL DATA:  Chest pain. EXAM: CHEST - 2 VIEW COMPARISON:  03/19/2018 FINDINGS: The cardiomediastinal silhouette is within normal limits. The lungs are well inflated and clear. There is no evidence of pleural effusion or pneumothorax. No acute osseous abnormality is identified. IMPRESSION: No active cardiopulmonary disease. Electronically Signed   By: Sebastian AcheAllen  Grady M.D.   On: 12/08/2018 19:28    Procedures Procedures (including critical care time)  Medications Ordered in UC Medications - No data to display  Initial Impression / Assessment and Plan / UC Course  I have reviewed the triage vital signs and the nursing notes.  Pertinent labs & imaging results that were available during my care of the patient were reviewed by me and considered in my medical decision making (see chart for details).     MDM   Ekg sinus brady at 60, nonspec t, non acute no stemi   Final Clinical Impressions(s) / UC Diagnoses   Final diagnoses:  Chest wall pain   Discharge Instructions   None    ED Prescriptions    Medication Sig Dispense Auth. Provider   methocarbamol (ROBAXIN) 500 MG tablet Take 1 tablet (500 mg total) by mouth 2 (two) times daily. 20 tablet Elson AreasSofia, Kenyetta Wimbish K, [place]-C     Controlled Substance Prescriptions [place] Controlled Substance Registry consulted? Not Applicable  An After Visit Summary was printed and given to the patient.    Elson AreasSofia, Merna Baldi K,  [place] 12/08/18 1933

## 2018-12-08 NOTE — Discharge Instructions (Addendum)
Return if any problems. Schedule to see your Gi doctor for recheck  Tylenol for pain

## 2019-02-09 ENCOUNTER — Emergency Department (HOSPITAL_COMMUNITY)
Admission: EM | Admit: 2019-02-09 | Discharge: 2019-02-10 | Disposition: A | Discharge status: Home / Self Care | Payer: 59 | Attending: Emergency Medicine | Admitting: Emergency Medicine

## 2019-02-09 ENCOUNTER — Encounter (HOSPITAL_COMMUNITY): Payer: Self-pay

## 2019-02-09 ENCOUNTER — Other Ambulatory Visit: Payer: Self-pay

## 2019-02-09 ENCOUNTER — Emergency Department (HOSPITAL_COMMUNITY): Payer: 59

## 2019-02-09 DIAGNOSIS — Z79899 Other long term (current) drug therapy: Secondary | ICD-10-CM | POA: Diagnosis not present

## 2019-02-09 DIAGNOSIS — F17210 Nicotine dependence, cigarettes, uncomplicated: Secondary | ICD-10-CM | POA: Diagnosis not present

## 2019-02-09 DIAGNOSIS — K5900 Constipation, unspecified: Secondary | ICD-10-CM | POA: Diagnosis not present

## 2019-02-09 LAB — COMPREHENSIVE METABOLIC PANEL
ALT: 30 U/L (ref 0–44)
AST: 34 U/L (ref 15–41)
Albumin: 4.6 g/dL (ref 3.5–5.0)
Alkaline Phosphatase: 84 U/L (ref 38–126)
Anion gap: 11 (ref 5–15)
BUN: 10 mg/dL (ref 6–20)
CO2: 24 mmol/L (ref 22–32)
Calcium: 9.4 mg/dL (ref 8.9–10.3)
Chloride: 96 mmol/L — ABNORMAL LOW (ref 98–111)
Creatinine, Ser: 0.86 mg/dL (ref 0.61–1.24)
GFR calc Af Amer: >60 mL/min (ref >60)
GFR calc non Af Amer: >60 mL/min (ref >60)
Glucose, Bld: 100 mg/dL — ABNORMAL HIGH (ref 70–99)
Potassium: 3.3 mmol/L — ABNORMAL LOW (ref 3.5–5.1)
Sodium: 131 mmol/L — ABNORMAL LOW (ref 135–145)
Total Bilirubin: 0.4 mg/dL (ref 0.3–1.2)
Total Protein: 7.1 g/dL (ref 6.5–8.1)

## 2019-02-09 LAB — CBC
HCT: 41.2 % (ref 39.0–52.0)
Hemoglobin: 13.7 g/dL (ref 13.0–17.0)
MCH: 30.2 pg (ref 26.0–34.0)
MCHC: 33.3 g/dL (ref 30.0–36.0)
MCV: 90.9 fL (ref 80.0–100.0)
Platelets: 251 10*3/uL (ref 150–400)
RBC: 4.53 MIL/uL (ref 4.22–5.81)
RDW: 14.7 % (ref 11.5–15.5)
WBC: 5.7 10*3/uL (ref 4.0–10.5)
nRBC: 0 % (ref 0.0–0.2)

## 2019-02-09 NOTE — ED Triage Notes (Signed)
Pt reports having a BM this morning and couldn't get all of the stool out, thinks he is constipated, pt also reports some shortness of breath. States he thinks he is dehydrated. Pt a.o, nad noted

## 2019-02-10 ENCOUNTER — Ambulatory Visit (INDEPENDENT_AMBULATORY_CARE_PROVIDER_SITE_OTHER)
Admission: EM | Admit: 2019-02-10 | Discharge: 2019-02-10 | Disposition: A | Discharge status: Home / Self Care | Payer: 59 | Source: Home / Self Care

## 2019-02-10 ENCOUNTER — Encounter (HOSPITAL_COMMUNITY): Payer: Self-pay | Admitting: *Deleted

## 2019-02-10 ENCOUNTER — Other Ambulatory Visit: Payer: Self-pay

## 2019-02-10 DIAGNOSIS — M25511 Pain in right shoulder: Secondary | ICD-10-CM

## 2019-02-10 MED ORDER — DOCUSATE SODIUM 100 MG PO CAPS: ORAL_CAPSULE | ORAL | 0 refills | Status: AC

## 2019-02-10 MED ORDER — DOCUSATE SODIUM 100 MG PO CAPS
100.0000 mg | ORAL_CAPSULE | Freq: Once | ORAL | Status: AC
  Administered 2019-02-10: 100 mg via ORAL
  Filled 2019-02-10: qty 1

## 2019-02-10 MED ORDER — MELOXICAM 7.5 MG PO TABS: 7.5000 mg | ORAL_TABLET | Freq: Every day | ORAL | 0 refills | Status: AC

## 2019-02-10 MED ORDER — METHOCARBAMOL 500 MG PO TABS: 500.0000 mg | ORAL_TABLET | Freq: Two times a day (BID) | ORAL | 0 refills | Status: AC

## 2019-02-10 NOTE — ED Provider Notes (Signed)
[facility]    CSN: [id number] Arrival date & time: 02/10/19  1726     History   Chief Complaint Chief Complaint  Patient presents with  . Shoulder Pain    HPI [name] is a 33 y.o. male.   33 year old male comes in for 1 day history of right shoulder/thoracic back pain. State he was in the ED last night for abdominal discomfort and constipation. Towards the end of the visit, started have this pain. Denies injury/trauma. Denies pain at rest. Pain with movement, deep breathing. Denies cough. Denies swelling, erythema, warmth. Denies numbness/tingling. Has not taken anything for the symptoms.      Past Medical History:  Diagnosis Date  . Appendicitis with perforation 06/07/2011  . Heart murmur   . Ulnar fracture 05/03/2017  . Urinary retention with incomplete bladder emptying 06/07/2011    Patient Active Problem List   Diagnosis Date Noted  . Cyst of perianal area 05/03/2017  . Tobacco abuse 05/03/2017    Past Surgical History:  Procedure Laterality Date  . APPENDECTOMY         Home Medications    Prior to Admission medications   Medication Sig Start Date End Date Taking? Authorizing Provider  hyoscyamine (LEVSIN SL) 0.125 MG SL tablet Take 0.125 mg by mouth every 4 (four) hours as needed for cramping.  05/08/18  Yes [provider]  pantoprazole (PROTONIX) 40 MG tablet Take 1 tablet (40 mg total) by mouth daily. 10/27/18  Yes Derwood KaplanNanavati, Ankit, MD  albuterol (VENTOLIN HFA) 108 (90 Base) MCG/ACT inhaler Inhale 1-2 puffs into the lungs every 6 (six) hours as needed for wheezing or shortness of breath. 12/08/18   Elson AreasSofia, Leslie K, PA-C  dicyclomine (BENTYL) 20 MG tablet Take 1 tablet (20 mg total) by mouth 4 (four) times daily as needed for spasms (or abdominal cramping). Patient not taking: Reported on 06/15/2018 05/15/18   Dione BoozeGlick, David, MD  docusate sodium (COLACE) 100 MG capsule Take 2 tablets per day until stools are soft, then 1 per day until  regular. 02/10/19   Roxy HorsemanBrowning, Robert, PA-C  meloxicam (MOBIC) 7.5 MG tablet Take 1 tablet (7.5 mg total) by mouth daily. 02/10/19   Cathie HoopsYu,  V, PA-C  methocarbamol (ROBAXIN) 500 MG tablet Take 1 tablet (500 mg total) by mouth 2 (two) times daily. 02/10/19   Cathie HoopsYu,  V, PA-C  ondansetron (ZOFRAN) 4 MG tablet Take 1 tablet (4 mg total) by mouth every 6 (six) hours as needed for nausea or vomiting. Patient not taking: Reported on 06/15/2018 05/15/18   Dione BoozeGlick, David, MD    Family History Family History  Problem Relation Age of Onset  . Healthy Mother     Social History Social History   Tobacco Use  . Smoking status: Current Every Day Smoker    Packs/day: 1.00    Types: Cigarettes  . Smokeless tobacco: Never Used  Substance Use Topics  . Alcohol use: Yes    Comment: several 12 oz beers per day  . Drug use: Not Currently    Types: Marijuana    Comment: denies     Allergies   Patient has no known allergies.   Review of Systems Review of Systems  Reason unable to perform ROS: See HPI as above.     Physical Exam Triage Vital Signs ED Triage Vitals  Enc Vitals Group     BP 02/10/19 1809 120/86     Pulse Rate 02/10/19 1809 82     Resp 02/10/19  1809 16     Temp 02/10/19 1809 98.2 F (36.8 C)     Temp Source 02/10/19 1809 Oral     SpO2 02/10/19 1809 100 %     Weight --      Height --      Head Circumference --      Peak Flow --      Pain Score 02/10/19 1810 10     Pain Loc --      Pain Edu? --      Excl. in [place]? --    No data found.  Updated Vital Signs BP 120/86   Pulse 82   Temp 98.2 F (36.8 C) (Oral)   Resp 16   SpO2 100%   Physical Exam Constitutional:      General: He is not in acute distress.    Appearance: He is well-developed. He is not ill-appearing, toxic-appearing or diaphoretic.     Comments: Patient sitting on exam table comfortably without any distress. No obvious signs of distress, pain.  HENT:     Head: Normocephalic and atraumatic.  Eyes:      Conjunctiva/sclera: Conjunctivae normal.     Pupils: Pupils are equal, round, and reactive to light.  Cardiovascular:     Rate and Rhythm: Normal rate and regular rhythm.     Heart sounds: Normal heart sounds. No murmur. No friction rub. No gallop.   Pulmonary:     Effort: Pulmonary effort is normal. No accessory muscle usage or respiratory distress.     Breath sounds: Normal breath sounds. No stridor. No decreased breath sounds, wheezing, rhonchi or rales.  Musculoskeletal:     Comments: No tenderness on palpation of the spinous processes. Tenderness to palpation of right thoracic back. No tenderness to palpation of the shoulder/clavical. Full range of motion of neck, shoulder, back, elbow. Strength normal and equal bilaterally. Normal grip strength. Sensation intact and equal bilaterally.  Radial pulses 2+ and equal bilaterally. Capillary refill less than 2 seconds.   Skin:    General: Skin is warm and dry.  Neurological:     Mental Status: He is alert and oriented to person, place, and time.      UC Treatments / Results  Labs (all labs ordered are listed, but only abnormal results are displayed) Labs Reviewed - No data to display  EKG   Radiology Dg Chest 2 View  Result Date: 02/09/2019 CLINICAL DATA:  Initial evaluation for acute shortness of breath. EXAM: CHEST - 2 VIEW COMPARISON:  Prior radiograph from 12/08/2018. FINDINGS: The cardiac and mediastinal silhouettes are stable in size and contour, and remain within normal limits. The lungs are normally inflated. No airspace consolidation, pleural effusion, or pulmonary edema is identified. There is no pneumothorax. No acute osseous abnormality identified. IMPRESSION: No active cardiopulmonary disease. Electronically Signed   By: Jeannine Boga M.D.   On: 02/09/2019 23:07    Procedures Procedures (including critical care time)  Medications Ordered in UC Medications - No data to display  Initial Impression /  Assessment and Plan / UC Course  I have reviewed the triage vital signs and the nursing notes.  Pertinent labs & imaging results that were available during my care of the patient were reviewed by me and considered in my medical decision making (see chart for details).    Atraumatic shoulder pain. Given has not taken anything, discussed ibuprofen/tylenol use. Patient states would like something more than that. Will provide mobic and robaxin. Patient requesting xray, discussed no  indications for xray, if symptoms not improving, to follow up with PCP for further evaluation needed.  Final Clinical Impressions(s) / UC Diagnoses   Final diagnoses:  Acute pain of right shoulder   ED Prescriptions    Medication Sig Dispense Auth. Provider   meloxicam (MOBIC) 7.5 MG tablet Take 1 tablet (7.5 mg total) by mouth daily. 15 tablet ,  V, PA-C   methocarbamol (ROBAXIN) 500 MG tablet Take 1 tablet (500 mg total) by mouth 2 (two) times daily. 20 tablet Threasa Alpha,  V, PA-C        ,  V, [place]PA-C 02/10/19 1904

## 2019-02-10 NOTE — ED Provider Notes (Signed)
[facility] EMERGENCY DEPARTMENT Provider Note   [facility]: [id number] [facility]     History   Chief Complaint Chief Complaint  Patient presents with  . Shortness of Breath  . Constipation    HPI [name] is a 33 y.o. male.     Patient presents to the emergency department with a chief complaint of constipation.  He states that he has been constipated for the past few days and has had some watery diarrhea as well.  States that today he was bearing down trying to have a bowel movement and about passed out.  He states that he became lightheaded.  He has tried taking "Fiberchoice" and drinking good amounts of water.  He denies any pain.  Denies any fevers chills.  Denies any other associated symptoms.  No shortness of breath described in triage was actually his lightheadedness and feeling like he is going to pass out.  The history is provided by the patient. No language interpreter was used.    Past Medical History:  Diagnosis Date  . Appendicitis with perforation 06/07/2011  . Heart murmur   . Ulnar fracture 05/03/2017  . Urinary retention with incomplete bladder emptying 06/07/2011    Patient Active Problem List   Diagnosis Date Noted  . Cyst of perianal area 05/03/2017  . Tobacco abuse 05/03/2017    Past Surgical History:  Procedure Laterality Date  . APPENDECTOMY          Home Medications    Prior to Admission medications   Medication Sig Start Date End Date Taking? Authorizing Provider  albuterol (VENTOLIN HFA) 108 (90 Base) MCG/ACT inhaler Inhale 1-2 puffs into the lungs every 6 (six) hours as needed for wheezing or shortness of breath. 12/08/18   Elson AreasSofia, Leslie K, PA-C  dicyclomine (BENTYL) 20 MG tablet Take 1 tablet (20 mg total) by mouth 4 (four) times daily as needed for spasms (or abdominal cramping). Patient not taking: Reported on 06/15/2018 05/15/18   Dione BoozeGlick, David, MD  docusate sodium (COLACE) 100 MG capsule Take 2  tablets per day until stools are soft, then 1 per day until regular. 02/10/19   Roxy HorsemanBrowning, Seraphim Affinito, PA-C  hyoscyamine (LEVSIN SL) 0.125 MG SL tablet Take 0.125 mg by mouth every 4 (four) hours as needed for cramping.  05/08/18   [provider]  methocarbamol (ROBAXIN) 500 MG tablet Take 1 tablet (500 mg total) by mouth 2 (two) times daily. 12/08/18   Elson AreasSofia, Leslie K, PA-C  methocarbamol (ROBAXIN) 500 MG tablet Take 1 tablet (500 mg total) by mouth 2 (two) times daily. 12/08/18   Elson AreasSofia, Leslie K, PA-C  ondansetron (ZOFRAN) 4 MG tablet Take 1 tablet (4 mg total) by mouth every 6 (six) hours as needed for nausea or vomiting. Patient not taking: Reported on 06/15/2018 05/15/18   Dione BoozeGlick, David, MD  pantoprazole (PROTONIX) 40 MG tablet Take 1 tablet (40 mg total) by mouth daily. 10/27/18   Derwood KaplanNanavati, Ankit, MD    Family History Family History  Problem Relation Age of Onset  . Healthy Mother     Social History Social History   Tobacco Use  . Smoking status: Current Every Day Smoker    Packs/day: 1.00    Types: Cigarettes  . Smokeless tobacco: Never Used  Substance Use Topics  . Alcohol use: Yes    Comment: several 12 oz beers per day  . Drug use: Not Currently    Types: Marijuana    Comment:  denies     Allergies   Patient has no known allergies.   Review of Systems Review of Systems  All other systems reviewed and are negative.    Physical Exam Updated Vital Signs BP (!) 142/84 (BP Location: Right Arm)   Pulse 60   Temp 98.1 F (36.7 C) (Oral)   Resp 20   SpO2 100%   Physical Exam Vitals signs and nursing note reviewed.  Constitutional:      Appearance: He is well-developed.  HENT:     Head: Normocephalic and atraumatic.  Eyes:     Conjunctiva/sclera: Conjunctivae normal.  Neck:     Musculoskeletal: Neck supple.  Cardiovascular:     Rate and Rhythm: Normal rate and regular rhythm.     Heart sounds: No murmur.  Pulmonary:     Effort: Pulmonary effort is normal.  No respiratory distress.     Breath sounds: Normal breath sounds.  Abdominal:     Palpations: Abdomen is soft.     Tenderness: There is no abdominal tenderness.     Comments: Abdomen is soft and nontender, bowel sounds are present in all 4 quadrants  Skin:    General: Skin is warm and dry.  Neurological:     Mental Status: He is alert and oriented to person, place, and time.  Psychiatric:        Mood and Affect: Mood normal.        Behavior: Behavior normal.      ED Treatments / Results  Labs (all labs ordered are listed, but only abnormal results are displayed) Labs Reviewed  COMPREHENSIVE METABOLIC PANEL - Abnormal; Notable for the following components:      Result Value   Sodium 131 (*)    Potassium 3.3 (*)    Chloride 96 (*)    Glucose, Bld 100 (*)    All other components within normal limits  CBC    EKG None  Radiology Dg Chest 2 View  Result Date: 02/09/2019 CLINICAL DATA:  Initial evaluation for acute shortness of breath. EXAM: CHEST - 2 VIEW COMPARISON:  Prior radiograph from 12/08/2018. FINDINGS: The cardiac and mediastinal silhouettes are stable in size and contour, and remain within normal limits. The lungs are normally inflated. No airspace consolidation, pleural effusion, or pulmonary edema is identified. There is no pneumothorax. No acute osseous abnormality identified. IMPRESSION: No active cardiopulmonary disease. Electronically Signed   By: Rise MuBenjamin  McClintock M.D.   On: 02/09/2019 23:07    Procedures Procedures (including critical care time)  Medications Ordered in ED Medications  docusate sodium (COLACE) capsule 100 mg (has no administration in time range)     Initial Impression / Assessment and Plan / ED Course  I have reviewed the triage vital signs and the nursing notes.  Pertinent labs & imaging results that were available during my care of the patient were reviewed by me and considered in my medical decision making (see chart for details).         Patient with constipation.  Abdomen is soft and nontender.  Bowel sounds present in all 4 quadrants.  Sounds like he had a vasovagal event while trying to bear down earlier today.  Will prescribe stool softener.  Labs and vitals are reassuring.  Discharged home.  Final Clinical Impressions(s) / ED Diagnoses   Final diagnoses:  Constipation, unspecified constipation type    ED Discharge Orders         Ordered    docusate sodium (COLACE) 100 MG capsule  02/10/19 0238           Montine Circle, PA-C 02/10/19 0241    Ezequiel Essex, MD 02/10/19 [phone]

## 2019-02-10 NOTE — ED Notes (Signed)
Patient verbalizes understanding of discharge instructions. Opportunity for questioning and answers were provided. Armband removed by staff, pt discharged from ED.  

## 2019-02-10 NOTE — Discharge Instructions (Addendum)
Start Mobic. Do not take ibuprofen (motrin/advil)/ naproxen (aleve) while on mobic. Robaxin as needed, this can make you drowsy, so do not take if you are going to drive, operate heavy machinery, or make important decisions. Ice/heat compresses as needed. This can take up to 3-4 weeks to completely resolve, but you should be feeling better each week. Follow up with PCP if symptoms worsen, changes for reevaluation.

## 2019-02-10 NOTE — ED Triage Notes (Signed)
Denies injury.  C/O starting with right scapular pain last night toward the end of ED visit (states was there for something else).  Describes pain as going through to right chest, worse with deep breathing and movement.

## 2019-06-25 ENCOUNTER — Other Ambulatory Visit: Payer: Self-pay | Admitting: Gastroenterology

## 2019-06-25 DIAGNOSIS — K625 Hemorrhage of anus and rectum: Secondary | ICD-10-CM

## 2019-07-20 ENCOUNTER — Inpatient Hospital Stay: Admission: RE | Admit: 2019-07-20 | Payer: 59 | Source: Ambulatory Visit

## 2019-08-02 ENCOUNTER — Other Ambulatory Visit: Payer: 59

## 2019-08-06 ENCOUNTER — Other Ambulatory Visit: Payer: 59

## 2019-10-31 ENCOUNTER — Encounter (HOSPITAL_COMMUNITY): Payer: Self-pay

## 2019-10-31 ENCOUNTER — Emergency Department (HOSPITAL_COMMUNITY)
Admission: EM | Admit: 2019-10-31 | Discharge: 2019-11-01 | Disposition: A | Discharge status: Home / Self Care | Payer: 59 | Attending: Emergency Medicine | Admitting: Emergency Medicine

## 2019-10-31 ENCOUNTER — Other Ambulatory Visit: Payer: Self-pay

## 2019-10-31 DIAGNOSIS — K625 Hemorrhage of anus and rectum: Secondary | ICD-10-CM | POA: Diagnosis not present

## 2019-10-31 DIAGNOSIS — R1031 Right lower quadrant pain: Secondary | ICD-10-CM | POA: Insufficient documentation

## 2019-10-31 DIAGNOSIS — Z79899 Other long term (current) drug therapy: Secondary | ICD-10-CM | POA: Diagnosis not present

## 2019-10-31 DIAGNOSIS — F17210 Nicotine dependence, cigarettes, uncomplicated: Secondary | ICD-10-CM | POA: Insufficient documentation

## 2019-10-31 DIAGNOSIS — K5900 Constipation, unspecified: Secondary | ICD-10-CM | POA: Insufficient documentation

## 2019-10-31 LAB — COMPREHENSIVE METABOLIC PANEL
ALT: 32 U/L (ref 0–44)
AST: 24 U/L (ref 15–41)
Albumin: 4.6 g/dL (ref 3.5–5.0)
Alkaline Phosphatase: 72 U/L (ref 38–126)
Anion gap: 11 (ref 5–15)
BUN: 6 mg/dL (ref 6–20)
CO2: 28 mmol/L (ref 22–32)
Calcium: 9.6 mg/dL (ref 8.9–10.3)
Chloride: 99 mmol/L (ref 98–111)
Creatinine, Ser: 0.77 mg/dL (ref 0.61–1.24)
GFR calc Af Amer: >60 mL/min (ref >60)
GFR calc non Af Amer: >60 mL/min (ref >60)
Glucose, Bld: 90 mg/dL (ref 70–99)
Potassium: 4.1 mmol/L (ref 3.5–5.1)
Sodium: 138 mmol/L (ref 135–145)
Total Bilirubin: 0.4 mg/dL (ref 0.3–1.2)
Total Protein: 7.2 g/dL (ref 6.5–8.1)

## 2019-10-31 LAB — URINALYSIS, ROUTINE W REFLEX MICROSCOPIC
Bilirubin Urine: NEGATIVE
Glucose, UA: NEGATIVE mg/dL
Hgb urine dipstick: NEGATIVE
Ketones, ur: NEGATIVE mg/dL
Leukocytes,Ua: NEGATIVE
Nitrite: NEGATIVE
Protein, ur: NEGATIVE mg/dL
Specific Gravity, Urine: 1.011 (ref 1.005–1.030)
pH: 9 — ABNORMAL HIGH (ref 5.0–8.0)

## 2019-10-31 LAB — CBC
HCT: 41.9 % (ref 39.0–52.0)
Hemoglobin: 13.6 g/dL (ref 13.0–17.0)
MCH: 29.9 pg (ref 26.0–34.0)
MCHC: 32.5 g/dL (ref 30.0–36.0)
MCV: 92.1 fL (ref 80.0–100.0)
Platelets: 226 10*3/uL (ref 150–400)
RBC: 4.55 MIL/uL (ref 4.22–5.81)
RDW: 14.3 % (ref 11.5–15.5)
WBC: 7.8 10*3/uL (ref 4.0–10.5)
nRBC: 0 % (ref 0.0–0.2)

## 2019-10-31 LAB — LIPASE, BLOOD: Lipase: 25 U/L (ref 11–51)

## 2019-10-31 NOTE — ED Triage Notes (Signed)
Pt arrives POV for eval of RLQ abd pain onset yesterday, worse today. Denies N/V/D. States appendix has been removed prior to.

## 2019-11-01 ENCOUNTER — Encounter (HOSPITAL_COMMUNITY): Payer: Self-pay | Admitting: Radiology

## 2019-11-01 ENCOUNTER — Emergency Department (HOSPITAL_COMMUNITY): Payer: 59

## 2019-11-01 LAB — POC OCCULT BLOOD, ED: Fecal Occult Bld: NEGATIVE

## 2019-11-01 NOTE — ED Notes (Signed)
bladder scan 236 ml

## 2019-11-01 NOTE — ED Provider Notes (Signed)
[facility] EMERGENCY DEPARTMENT Provider Note   CSN: [id number] Arrival date & time: 10/31/19  2224     History Chief Complaint  Patient presents with  . Abdominal Pain    [name] is a 34 y.o. male.  HPI 34 year old male with a history of constipation presents to the ER with right lower abdominal pain x2 days.  He states he started feeling right lower abdominal pain yesterday that was relieved with urination.  He also reports that he had pain in his groin, but that improved earlier today until he went to work around 2 PM when his progressively worsened.  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  He currently rates the pain a 6/10 and is a dull aching pain which is worse on movement. He is unsure if this is musculoskeletal pain or abdominal pain.  He endorses blood in the toilet boil after a bowel movement yesterday and black tarry stools. He states he has a h/o constipation and takes stool softeners every now and then.  He denies fever, chills, diarrhea, hematuria/dysuria, flank pain, testicular pain, penile discharge.     Past Medical History:  Diagnosis Date  . Appendicitis with perforation 06/07/2011  . Heart murmur   . Ulnar fracture 05/03/2017  . Urinary retention with incomplete bladder emptying 06/07/2011    Patient Active Problem List   Diagnosis Date Noted  . Cyst of perianal area 05/03/2017  . Tobacco abuse 05/03/2017    Past Surgical History:  Procedure Laterality Date  . APPENDECTOMY         Family History  Problem Relation Age of Onset  . Healthy Mother     Social History   Tobacco Use  . Smoking status: Current Every Day Smoker    Packs/day: 1.00    Types: Cigarettes  . Smokeless tobacco: Never Used  Substance Use Topics  . Alcohol use: Yes    Comment: several 12 oz beers per day  . Drug use: Not Currently    Types: Marijuana    Comment: denies    Home Medications Prior to Admission medications     Medication Sig Start Date End Date Taking? Authorizing Provider  albuterol (VENTOLIN HFA) 108 (90 Base) MCG/ACT inhaler Inhale 1-2 puffs into the lungs every 6 (six) hours as needed for wheezing or shortness of breath. 12/08/18   Elson Areas, PA-C  dicyclomine (BENTYL) 20 MG tablet Take 1 tablet (20 mg total) by mouth 4 (four) times daily as needed for spasms (or abdominal cramping). Patient not taking: Reported on 06/15/2018 05/15/18   Dione Booze, MD  docusate sodium (COLACE) 100 MG capsule Take 2 tablets per day until stools are soft, then 1 per day until regular. 02/10/19   Roxy Horseman, PA-C  hyoscyamine (LEVSIN SL) 0.125 MG SL tablet Take 0.125 mg by mouth every 4 (four) hours as needed for cramping.  05/08/18   [provider]  meloxicam (MOBIC) 7.5 MG tablet Take 1 tablet (7.5 mg total) by mouth daily. 02/10/19   Cathie Hoops, Amy V, PA-C  methocarbamol (ROBAXIN) 500 MG tablet Take 1 tablet (500 mg total) by mouth 2 (two) times daily. 02/10/19   Cathie Hoops, Amy V, PA-C  ondansetron (ZOFRAN) 4 MG tablet Take 1 tablet (4 mg total) by mouth every 6 (six) hours as needed for nausea or vomiting. Patient not taking: Reported on 06/15/2018 05/15/18   Dione Booze, MD  pantoprazole (PROTONIX) 40 MG tablet Take 1 tablet (40  mg total) by mouth daily. 10/27/18   Varney Biles, MD    Allergies    Patient has no known allergies.  Review of Systems   Review of Systems  Constitutional: Negative for chills, fatigue and fever.  Respiratory: Negative for cough and shortness of breath.   Cardiovascular: Negative for chest pain.  Gastrointestinal: Positive for abdominal pain, anal bleeding, blood in stool and constipation. Negative for abdominal distention, diarrhea, nausea, rectal pain and vomiting.  Genitourinary: Negative for decreased urine volume, difficulty urinating, discharge, dysuria, flank pain, genital sores, hematuria, penile pain and urgency.  Musculoskeletal: Negative for myalgias.   Neurological: Negative for dizziness and syncope.    Physical Exam Updated Vital Signs BP 122/79 (BP Location: Right Arm)   Pulse 76   Temp 98.1 F (36.7 C) (Oral)   Resp 18   Ht 5\' 9"  (1.753 m)   Wt 59 kg   SpO2 98%   BMI 19.20 kg/m   Physical Exam Constitutional:      Appearance: Normal appearance. He is well-developed.  HENT:     Head: Normocephalic and atraumatic.  Eyes:     Extraocular Movements: Extraocular movements intact.     Conjunctiva/sclera: Conjunctivae normal.     Pupils: Pupils are equal, round, and reactive to light.  Cardiovascular:     Rate and Rhythm: Normal rate and regular rhythm.     Pulses: Normal pulses.     Heart sounds: Normal heart sounds.  Pulmonary:     Effort: Pulmonary effort is normal.     Breath sounds: Normal breath sounds.  Abdominal:     General: Abdomen is flat. Bowel sounds are normal. There is no distension.     Palpations: Abdomen is soft. There is no mass.     Tenderness: There is abdominal tenderness in the right lower quadrant. There is no right CVA tenderness, left CVA tenderness, guarding or rebound. Negative signs include Murphy's sign and McBurney's sign.     Hernia: No hernia is present.  Genitourinary:    Testes: Normal. Cremasteric reflex is present.        Right: Mass or tenderness not present.        Left: Mass or tenderness not present.     Prostate: Normal.     Rectum: Normal. No mass or tenderness.     Comments: No appreciable hemorrhoids, no noticeable fissures Musculoskeletal:        General: Normal range of motion.     Cervical back: Normal range of motion. No tenderness.  Skin:    General: Skin is warm and dry.     Coloration: Skin is not jaundiced.  Neurological:     General: No focal deficit present.     Mental Status: He is alert and oriented to person, place, and time.  Psychiatric:        Mood and Affect: Mood normal.        Behavior: Behavior normal.     ED Results / Procedures / Treatments    Labs (all labs ordered are listed, but only abnormal results are displayed) Labs Reviewed  URINALYSIS, ROUTINE W REFLEX MICROSCOPIC - Abnormal; Notable for the following components:      Result Value   pH 9.0 (*)    All other components within normal limits  LIPASE, BLOOD  COMPREHENSIVE METABOLIC PANEL  CBC  POC OCCULT BLOOD, ED    EKG None  Radiology CT Renal Stone Study  Result Date: 11/01/2019 CLINICAL DATA:  Right-sided abdominal pain EXAM:  CT ABDOMEN AND PELVIS WITHOUT CONTRAST TECHNIQUE: Multidetector CT imaging of the abdomen and pelvis was performed following the standard protocol without IV contrast. COMPARISON:  07/04/2018 FINDINGS: Lower chest: No acute abnormality. Hepatobiliary: No focal liver abnormality is seen. No gallstones, gallbladder wall thickening, or biliary dilatation. Pancreas: Unremarkable. No pancreatic ductal dilatation or surrounding inflammatory changes. Spleen: Normal in size without focal abnormality. Adrenals/Urinary Tract: Adrenal glands are within normal limits. Kidneys are well visualized bilaterally. No renal calculi or obstructive changes are seen. The bladder is decompressed. Stomach/Bowel: The appendix has been surgically removed. No obstructive or inflammatory changes of the large or small bowel are seen. Stomach is within normal limits. Vascular/Lymphatic: No significant vascular findings are present. No enlarged abdominal or pelvic lymph nodes. Reproductive: Prostate is unremarkable. Other: No abdominal wall hernia or abnormality. No abdominopelvic ascites. Musculoskeletal: Bilateral pars defects are noted at L5. No significant anterolisthesis is noted. No acute bony abnormality is noted. IMPRESSION: Status post appendectomy. No acute abnormality to correspond with the patient's given clinical history is noted. Electronically Signed   By: Alcide Clever M.D.   On: 11/01/2019 02:20    Procedures Procedures (including critical care time)  Medications  Ordered in ED Medications - No data to display  ED Course  I have reviewed the triage vital signs and the nursing notes.  Pertinent labs & imaging results that were available during my care of the patient were reviewed by me and considered in my medical decision making (see chart for details).  Clinical Course as of Oct 31 248  Thu Nov 01, 2019  0226 POC occult blood, ED [MB]    Clinical Course User Index [MB] Leone Brand   MDM Rules/Calculators/A&P                     34 year old male presents for right lower abdominal pain x2 days. Patient well-appearing, no acute distress, nontoxic.  Vitals nonconcerning. Abdominal exam showed nonfocal RLL quadrant pain. CBC without leukocytosis, CMP without significant electrolyte abnormalities or renal dysfunction, lipase normal, hemoccult negative for blood, no appreciable hemorrhoids or fissures on PE. Bladder scan volumes normal, CT Renal negative.  Doubt appendicitis (s/p appendectomy),diverticulitis,nephrolithiasis with obstruction, gallstones, pancreatitis, cystitis, urinary retention, or any other causes of acute abdomen.  Unclear what the cause of his abdominal pain is;  possible constipation/gas or musculoskeletal strain.  Encouraged the patient to follow-up with Dr. Levora Angel w/ GI in the next few days if symptoms do not improve. Tylenol/ibuprofen for musculoskeletal pain. Continue Miralax. Return precautions given.   Pt seen and evaluated by Ivar Drape PA-C and he agrees with this plan.   Final Clinical Impression(s) / ED Diagnoses Final diagnoses:  Right lower quadrant abdominal pain    Rx / DC Orders ED Discharge Orders    None       Leone Brand 11/01/19 0251    Marily Memos, MD 11/01/19 [phone]

## 2019-11-01 NOTE — Discharge Instructions (Addendum)
You were seen in the ER for abdominal pain. Your labwork and CT scan did not show any kidney stones, infections, severe constipation, or any other life threatening causes of abdominal pain. Your stool sample was negative for blood. Your symptoms might be musculoskeletal, take over the counter Tylenol or Ibuprofen if needed. Continue taking constipation medication if having difficulty passing stools. Please follow up with your GI doctor after this visit. Return to ER if symptoms worsen.

## 2019-11-01 NOTE — ED Notes (Signed)
  Patient voided after bladder scan

## 2020-02-07 ENCOUNTER — Encounter (HOSPITAL_COMMUNITY): Payer: Self-pay

## 2020-02-07 ENCOUNTER — Ambulatory Visit (HOSPITAL_COMMUNITY)
Admission: EM | Admit: 2020-02-07 | Discharge: 2020-02-07 | Disposition: A | Discharge status: Home / Self Care | Payer: 59 | Attending: Physician Assistant | Admitting: Physician Assistant

## 2020-02-07 ENCOUNTER — Other Ambulatory Visit: Payer: Self-pay

## 2020-02-07 DIAGNOSIS — M79674 Pain in right toe(s): Secondary | ICD-10-CM

## 2020-02-07 MED ORDER — IBUPROFEN 600 MG PO TABS: 600.0000 mg | ORAL_TABLET | Freq: Four times a day (QID) | ORAL | 0 refills | Status: AC | PRN

## 2020-02-07 NOTE — ED Provider Notes (Signed)
[facility]    CSN: [id number] Arrival date & time: 02/07/20  1547      History   Chief Complaint Chief Complaint  Patient presents with  . Foot Pain    HPI [name] is a 34 y.o. male.   Patient reports for bottom of right big toe pain. Pain started yesterday at work. Reports it hurt a lot of walk on. Denies trauma to foot. Denies fever or chills. Denies swelling or redness.  He wonders if wearing steel toe boots causes a lot.  He reports he has been working longer days recently.     Past Medical History:  Diagnosis Date  . Appendicitis with perforation 06/07/2011  . Heart murmur   . Ulnar fracture 05/03/2017  . Urinary retention with incomplete bladder emptying 06/07/2011    Patient Active Problem List   Diagnosis Date Noted  . Cyst of perianal area 05/03/2017  . Tobacco abuse 05/03/2017    Past Surgical History:  Procedure Laterality Date  . APPENDECTOMY         Home Medications    Prior to Admission medications   Medication Sig Start Date End Date Taking? Authorizing Provider  albuterol (VENTOLIN HFA) 108 (90 Base) MCG/ACT inhaler Inhale 1-2 puffs into the lungs every 6 (six) hours as needed for wheezing or shortness of breath. 12/08/18   Elson Areas, PA-C  dicyclomine (BENTYL) 20 MG tablet Take 1 tablet (20 mg total) by mouth 4 (four) times daily as needed for spasms (or abdominal cramping). Patient not taking: Reported on 06/15/2018 05/15/18   Dione Booze, MD  docusate sodium (COLACE) 100 MG capsule Take 2 tablets per day until stools are soft, then 1 per day until regular. 02/10/19   Roxy Horseman, PA-C  hyoscyamine (LEVSIN SL) 0.125 MG SL tablet Take 0.125 mg by mouth every 4 (four) hours as needed for cramping.  05/08/18   [provider]  ibuprofen (ADVIL) 600 MG tablet Take 1 tablet (600 mg total) by mouth every 6 (six) hours as needed. 02/07/20   Orian Figueira, Veryl Speak, PA-C  meloxicam (MOBIC) 7.5 MG tablet Take 1 tablet (7.5 mg total)  by mouth daily. 02/10/19   Cathie Hoops, Amy V, PA-C  methocarbamol (ROBAXIN) 500 MG tablet Take 1 tablet (500 mg total) by mouth 2 (two) times daily. 02/10/19   Cathie Hoops, Amy V, PA-C  ondansetron (ZOFRAN) 4 MG tablet Take 1 tablet (4 mg total) by mouth every 6 (six) hours as needed for nausea or vomiting. Patient not taking: Reported on 06/15/2018 05/15/18   Dione Booze, MD  pantoprazole (PROTONIX) 40 MG tablet Take 1 tablet (40 mg total) by mouth daily. 10/27/18   Derwood Kaplan, MD    Family History Family History  Problem Relation Age of Onset  . Healthy Mother     Social History Social History   Tobacco Use  . Smoking status: Current Every Day Smoker    Packs/day: 1.00    Types: Cigarettes  . Smokeless tobacco: Never Used  Vaping Use  . Vaping Use: Never used  Substance Use Topics  . Alcohol use: Yes    Alcohol/week: 8.0 standard drinks    Types: 8 Cans of beer per week  . Drug use: Not Currently    Types: Marijuana    Comment: denies     Allergies   Patient has no known allergies.   Review of Systems Review of Systems   Physical Exam Triage Vital Signs ED Triage Vitals  Enc Vitals  Group     BP 02/07/20 1639 123/81     Pulse Rate 02/07/20 1639 71     Resp 02/07/20 1639 16     Temp 02/07/20 1639 98 F (36.7 C)     Temp Source 02/07/20 1639 Oral     SpO2 02/07/20 1639 99 %     Weight 02/07/20 1712 135 lb (61.2 kg)     Height 02/07/20 1712 5\' 9"  (1.753 m)     Head Circumference --      Peak Flow --      Pain Score 02/07/20 1712 7     Pain Loc --      Pain Edu? --      Excl. in GC? --    No data found.  Updated Vital Signs BP 123/81 (BP Location: Left Arm)   Pulse 71   Temp 98 F (36.7 C) (Oral)   Resp 16   Ht 5\' 9"  (1.753 m)   Wt 135 lb (61.2 kg)   SpO2 99%   BMI 19.94 kg/m   Visual Acuity Right Eye Distance:   Left Eye Distance:   Bilateral Distance:    Right Eye Near:   Left Eye Near:    Bilateral Near:     Physical Exam Vitals and nursing note  reviewed.  Musculoskeletal:     Comments: No swelling or deformity of the right great toe. No redness, no warmth  Pain with passive extension. TTP over 1st MTP joint.   Patient is ambulatory.       UC Treatments / Results  Labs (all labs ordered are listed, but only abnormal results are displayed) Labs Reviewed - No data to display  EKG   Radiology No results found.  Procedures Procedures (including critical care time)  Medications Ordered in UC Medications - No data to display  Initial Impression / Assessment and Plan / UC Course  I have reviewed the triage vital signs and the nursing notes.  Pertinent labs & imaging results that were available during my care of the patient were reviewed by me and considered in my medical decision making (see chart for details).     #Great toe pain Patient is a 34 year old presenting with pain in the right great toe.  Differential would include tendinitis versus sesamoiditis.  Doubt infection or gout.  Will place in postop shoe and have a trial of NSAIDs.  Discussed follow-up with sports medicine group if not improving.  Patient verbalized understanding plan of care. Final Clinical Impressions(s) / UC Diagnoses   Final diagnoses:  Great toe pain, right     Discharge Instructions     Wear the shoe as much as possible  Take the ibuprofen every 6-8 hours  Ice the bottom of the toe      ED Prescriptions    Medication Sig Dispense Auth. Provider   ibuprofen (ADVIL) 600 MG tablet Take 1 tablet (600 mg total) by mouth every 6 (six) hours as needed. 30 tablet Lilibeth Opie, , PA-C     PDMP not reviewed this encounter.   38, PA-C 02/07/20 2327

## 2020-02-07 NOTE — ED Triage Notes (Signed)
Pt c/o 7/10 sharp pain in the posterior of left foot started yesterday. Pt states he couldn't put pressure on foot yesterday due to the pain. Pt states he wears steel toed boots for work and it some times hurt his feet, but this is the worse.

## 2020-02-07 NOTE — Discharge Instructions (Addendum)
Wear the shoe as much as possible  Take the ibuprofen every 6-8 hours  Ice the bottom of the toe

## 2020-05-11 IMAGING — RF DG ESOPHAGUS
6 series · 14 of 22 positions shown · non-contrast
Comparison: None.

CLINICAL DATA: Food getting caught in throat.

EXAM:
ESOPHOGRAM / BARIUM SWALLOW / BARIUM TABLET STUDY
TECHNIQUE: Combined double contrast and single contrast examination performed
using effervescent crystals, thick barium liquid, and thin barium
liquid. The patient was observed with fluoroscopy swallowing a 13 mm
barium sulphate tablet.
FLUOROSCOPY TIME:  Fluoroscopy Time:  1 minutes and 54 seconds
Radiation Exposure Index (if provided by the fluoroscopic device):
33 mGy
Number of Acquired Spot Images: 0

[Series 1: sequence · 0.28mm/px · 3 of 21 frames shown (1 of 5)]
[frame 1/21]
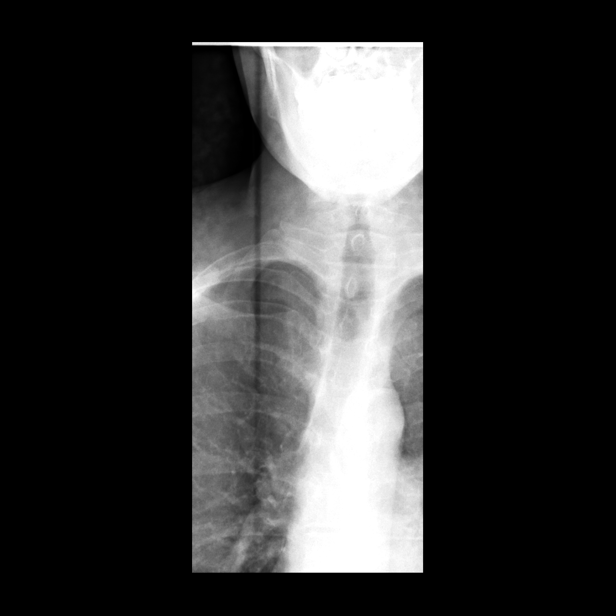
[frame 11/21]
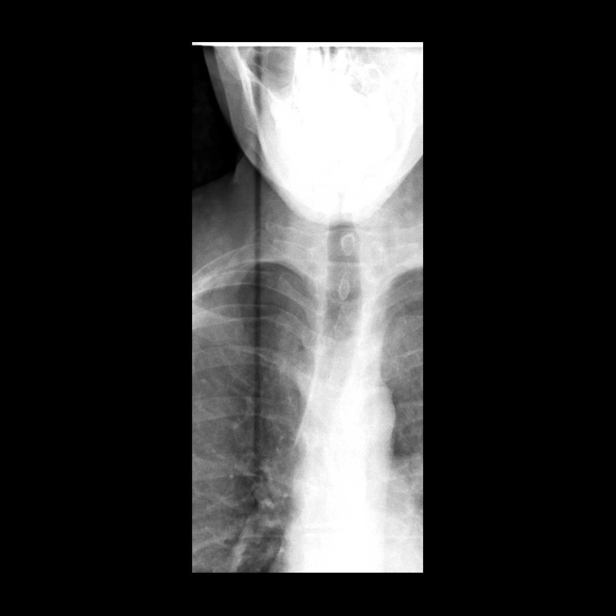
[frame 18/21]
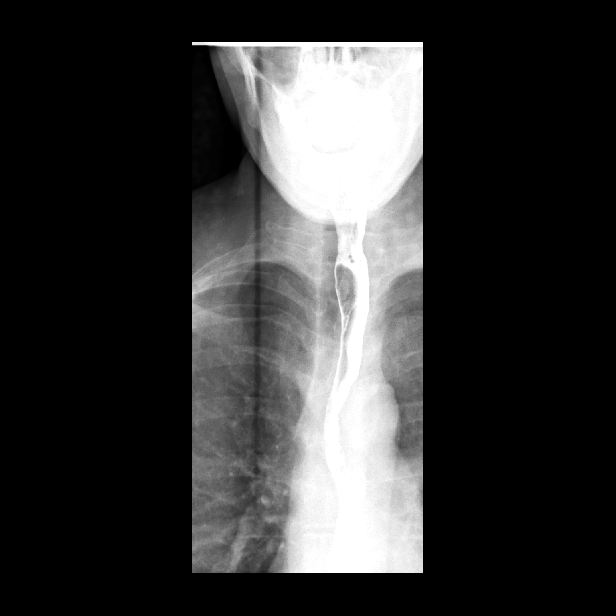

[Series 2: sequence · 0.28mm/px · 2 of 11 frames shown (2 of 5)]
[frame 2/11]
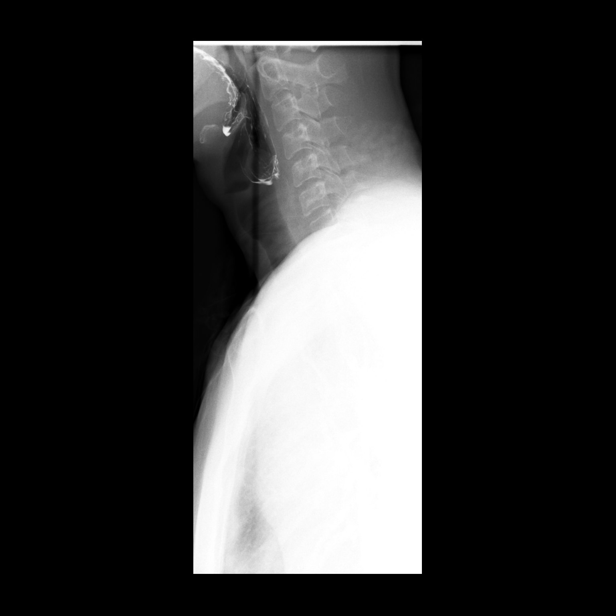
[frame 10/11]
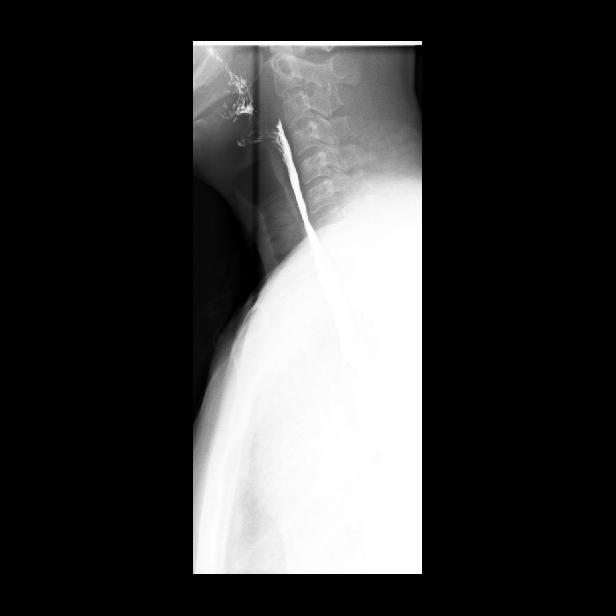

[Series 3: sequence · 0.28mm/px · 3 of 17 frames shown (3 of 5)]
[frame 3/17]
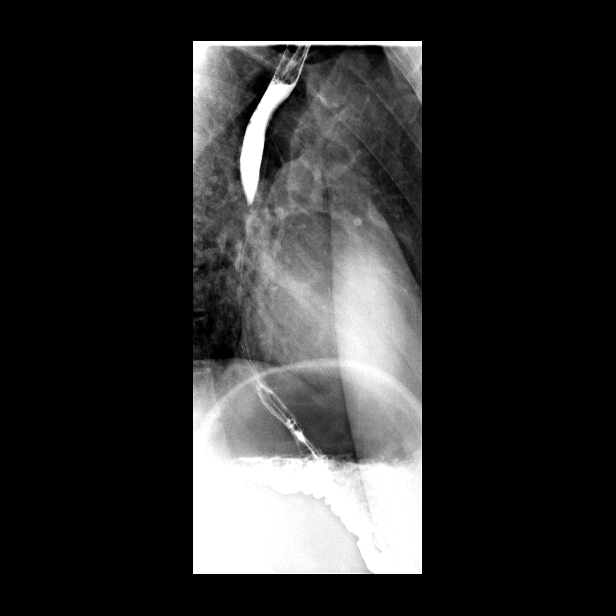
[frame 15/17]
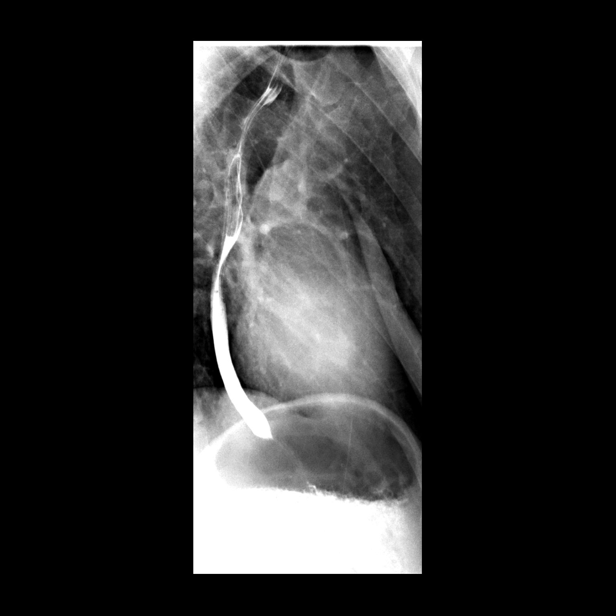
[frame 17/17]
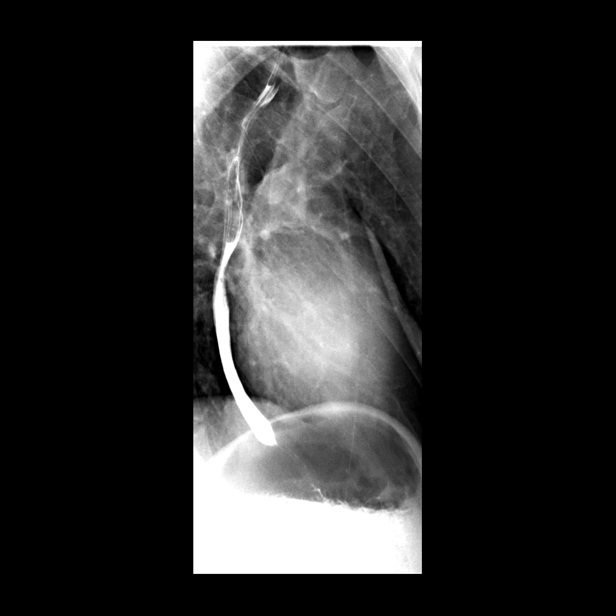

[Series 4: sequence · 2 of 70 frames shown (4 of 5)]
[frame 11/70]
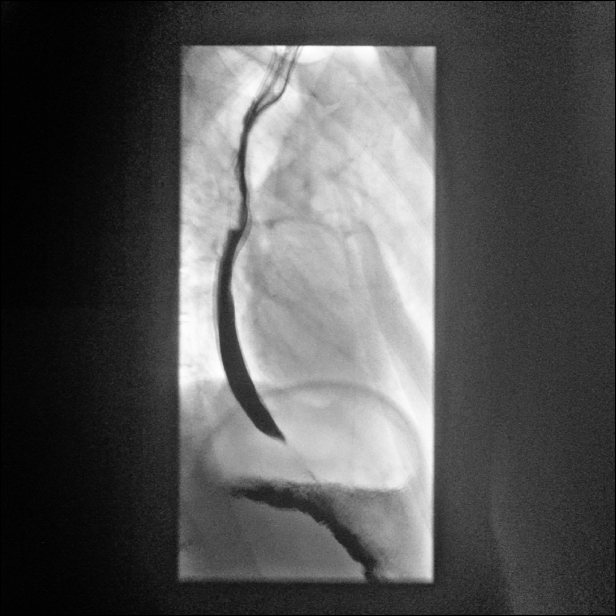
[frame 36/70]
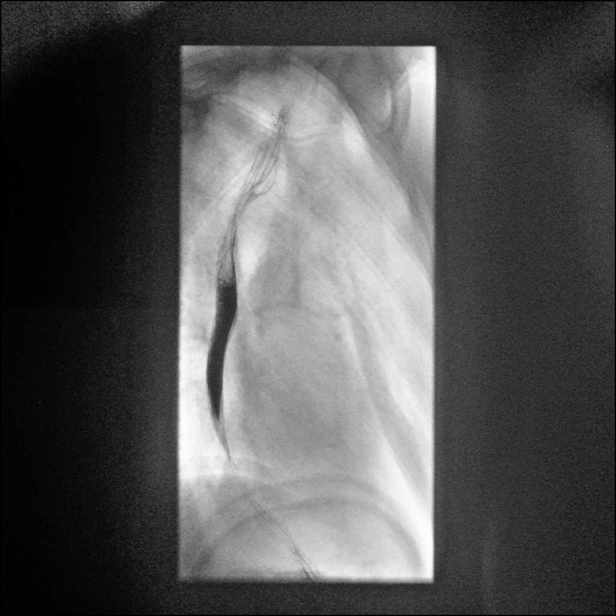

[Series 5: sequence · 3 of 37 frames shown (5 of 5)]
[frame 6/37]
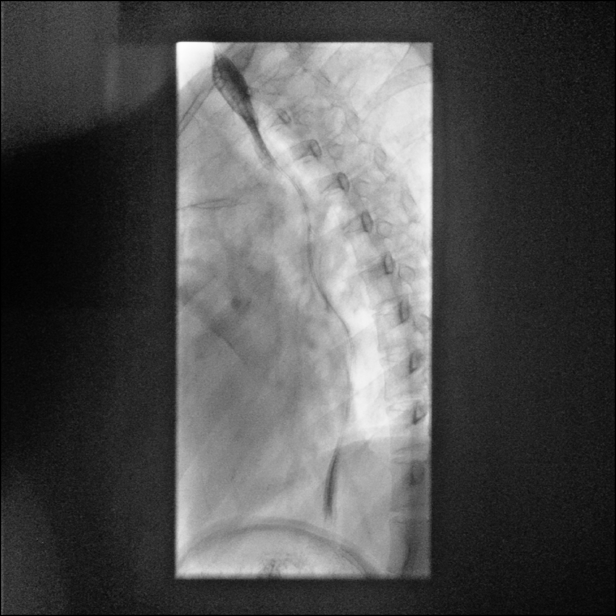
[frame 19/37]
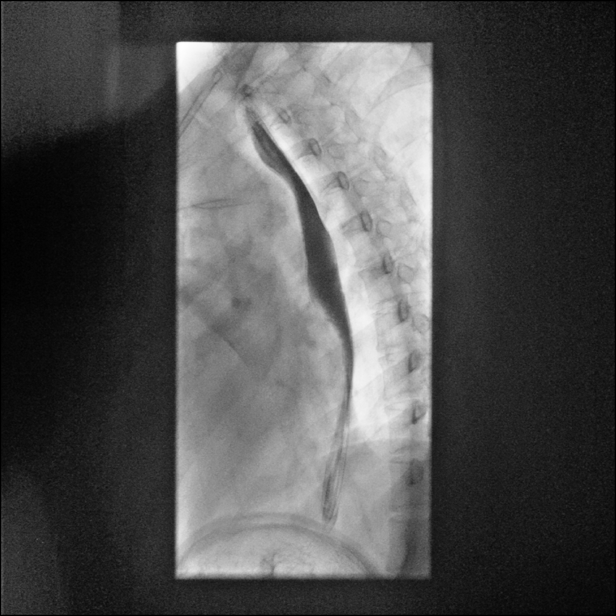
[frame 32/37]
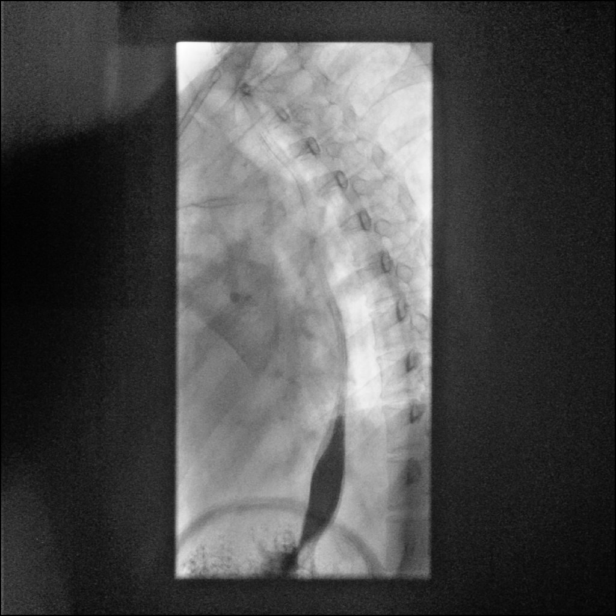

[Series 6: one shot · 1 of 2 slices shown]
[im 2/2]
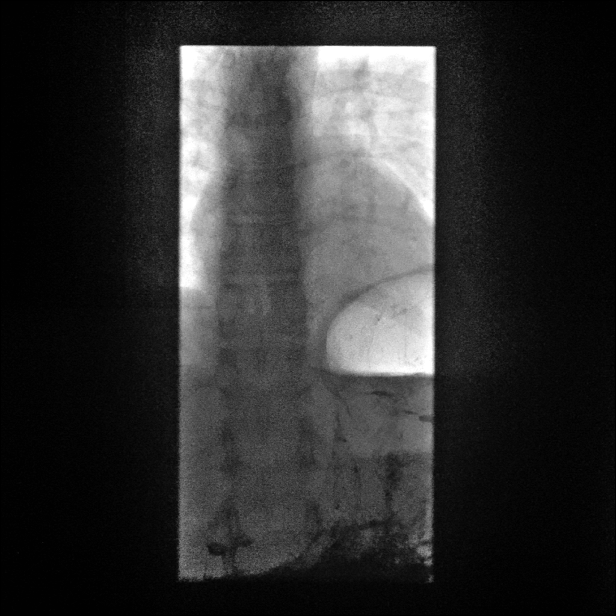

[14 of 22 positions shown; findings below may reference images not displayed]

FINDINGS: Frontal and lateral views of the hypopharynx while swallowing are
normal.

Double contrast imaging somewhat limited by patient trepidation with
drinking effervescent crystals followed rapidly by barium. Within
this limitation, there is no evidence for esophageal diverticulum,
esophageal mass lesion, gross mucosal ulceration, or stricture.

13 mm barium tablet passes readily into the stomach when taken with
water.
IMPRESSION: Normal double contrast barium esophagram.

## 2020-05-23 ENCOUNTER — Other Ambulatory Visit: Payer: Self-pay

## 2020-05-23 ENCOUNTER — Ambulatory Visit (HOSPITAL_COMMUNITY)
Admission: EM | Admit: 2020-05-23 | Discharge: 2020-05-23 | Disposition: A | Discharge status: Home / Self Care | Payer: 59 | Attending: Family Medicine | Admitting: Family Medicine

## 2020-05-23 ENCOUNTER — Encounter (HOSPITAL_COMMUNITY): Payer: Self-pay | Admitting: *Deleted

## 2020-05-23 DIAGNOSIS — S46912A Strain of unspecified muscle, fascia and tendon at shoulder and upper arm level, left arm, initial encounter: Secondary | ICD-10-CM | POA: Diagnosis not present

## 2020-05-23 MED ORDER — TRIAMCINOLONE ACETONIDE 40 MG/ML IJ SUSP
40.0000 mg | Freq: Once | INTRAMUSCULAR | Status: AC
  Administered 2020-05-23: 40 mg via INTRAMUSCULAR

## 2020-05-23 MED ORDER — TRIAMCINOLONE ACETONIDE 40 MG/ML IJ SUSP
INTRAMUSCULAR | Status: AC
  Filled 2020-05-23: qty 1

## 2020-05-23 NOTE — ED Provider Notes (Signed)
[facility]    CSN: [id number] Arrival date & time: 05/23/20  1323      History   Chief Complaint Chief Complaint  Patient presents with  . Shoulder Pain    HPI [name] is a 34 y.o. male.   Here today with left shoulder pain that he woke up with yesterday. The arm is stiff and movement seems to worsen the pain. States it feels like muscle soreness. He does heavy lifting at work and notes after his shift yesterday the pain was worse. So far not taking anything OTC for sxs. No known hx of shoulder or arm injuries. Denies numbness, tingling, weakness, swelling of extremity, color change.       Past Medical History:  Diagnosis Date  . Appendicitis with perforation 06/07/2011  . Heart murmur   . Ulnar fracture 05/03/2017  . Urinary retention with incomplete bladder emptying 06/07/2011    Patient Active Problem List   Diagnosis Date Noted  . Cyst of perianal area 05/03/2017  . Tobacco abuse 05/03/2017    Past Surgical History:  Procedure Laterality Date  . APPENDECTOMY         Home Medications    Prior to Admission medications   Medication Sig Start Date End Date Taking? Authorizing Provider  pantoprazole (PROTONIX) 40 MG tablet Take 1 tablet (40 mg total) by mouth daily. 10/27/18  Yes Derwood Kaplan, MD  albuterol (VENTOLIN HFA) 108 (90 Base) MCG/ACT inhaler Inhale 1-2 puffs into the lungs every 6 (six) hours as needed for wheezing or shortness of breath. 12/08/18   Elson Areas, PA-C  dicyclomine (BENTYL) 20 MG tablet Take 1 tablet (20 mg total) by mouth 4 (four) times daily as needed for spasms (or abdominal cramping). Patient not taking: Reported on 06/15/2018 05/15/18   Dione Booze, MD  docusate sodium (COLACE) 100 MG capsule Take 2 tablets per day until stools are soft, then 1 per day until regular. 02/10/19   Roxy Horseman, PA-C  hyoscyamine (LEVSIN SL) 0.125 MG SL tablet Take 0.125 mg by mouth every 4 (four) hours as needed for cramping.   05/08/18   [provider]  ibuprofen (ADVIL) 600 MG tablet Take 1 tablet (600 mg total) by mouth every 6 (six) hours as needed. 02/07/20   Darr, Veryl Speak, PA-C  meloxicam (MOBIC) 7.5 MG tablet Take 1 tablet (7.5 mg total) by mouth daily. 02/10/19   Cathie Hoops, Amy V, PA-C  methocarbamol (ROBAXIN) 500 MG tablet Take 1 tablet (500 mg total) by mouth 2 (two) times daily. 02/10/19   Cathie Hoops, Amy V, PA-C  ondansetron (ZOFRAN) 4 MG tablet Take 1 tablet (4 mg total) by mouth every 6 (six) hours as needed for nausea or vomiting. Patient not taking: Reported on 06/15/2018 05/15/18   Dione Booze, MD    Family History Family History  Problem Relation Age of Onset  . Healthy Mother     Social History Social History   Tobacco Use  . Smoking status: Current Every Day Smoker    Packs/day: 1.00    Types: Cigarettes  . Smokeless tobacco: Never Used  Vaping Use  . Vaping Use: Never used  Substance Use Topics  . Alcohol use: Yes    Alcohol/week: 8.0 standard drinks    Types: 8 Cans of beer per week  . Drug use: Not Currently    Types: Marijuana    Comment: denies     Allergies   Patient has no known allergies.   Review of  Systems Review of Systems PER HPI    Physical Exam Triage Vital Signs ED Triage Vitals  Enc Vitals Group     BP 05/23/20 1506 125/88     Pulse Rate 05/23/20 1506 63     Resp 05/23/20 1506 16     Temp 05/23/20 1506 97.9 F (36.6 C)     Temp Source 05/23/20 1506 Oral     SpO2 05/23/20 1506 100 %     Weight --      Height --      Head Circumference --      Peak Flow --      Pain Score 05/23/20 1504 7     Pain Loc --      Pain Edu? --      Excl. in GC? --    No data found.  Updated Vital Signs BP 125/88 (BP Location: Right Arm)   Pulse 63   Temp 97.9 F (36.6 C) (Oral)   Resp 16   SpO2 100%   Visual Acuity Right Eye Distance:   Left Eye Distance:   Bilateral Distance:    Right Eye Near:   Left Eye Near:    Bilateral Near:     Physical Exam Vitals  and nursing note reviewed.  Constitutional:      Appearance: Normal appearance.  HENT:     Head: Atraumatic.  Eyes:     Extraocular Movements: Extraocular movements intact.     Conjunctiva/sclera: Conjunctivae normal.  Cardiovascular:     Rate and Rhythm: Normal rate and regular rhythm.     Pulses: Normal pulses.  Pulmonary:     Effort: Pulmonary effort is normal.     Breath sounds: Normal breath sounds.  Abdominal:     General: Bowel sounds are normal.     Palpations: Abdomen is soft.  Musculoskeletal:        General: Tenderness (ttp left trapezius extending down into lateral left pectoral muscle ttp, with pain elicited more with ROM against resistance exercises to left UE) present. No deformity. Normal range of motion.     Cervical back: Normal range of motion and neck supple.     Left lower leg: No edema.  Skin:    General: Skin is warm and dry.     Findings: No bruising or erythema.  Neurological:     General: No focal deficit present.     Mental Status: He is oriented to person, place, and time.     Sensory: No sensory deficit.     Motor: No weakness.     Gait: Gait normal.  Psychiatric:        Mood and Affect: Mood normal.        Thought Content: Thought content normal.        Judgment: Judgment normal.      UC Treatments / Results  Labs (all labs ordered are listed, but only abnormal results are displayed) Labs Reviewed - No data to display  EKG   Radiology No results found.  Procedures Procedures (including critical care time)  Medications Ordered in UC Medications  triamcinolone acetonide (KENALOG-40) injection 40 mg (40 mg Intramuscular Given 05/23/20 1551)    Initial Impression / Assessment and Plan / UC Course  I have reviewed the triage vital signs and the nursing notes.  Pertinent labs & imaging results that were available during my care of the patient were reviewed by me and considered in my medical decision making (see chart for  details).  Consistent with muscle strain, will tx with IM kenalog and OTC tylenol and ibuprofen, heat, massage, rest. WOrk note given. Return precautions reviewed at length   Final Clinical Impressions(s) / UC Diagnoses   Final diagnoses:  Strain of left shoulder, initial encounter   Discharge Instructions   None    ED Prescriptions    None     PDMP not reviewed this encounter.   Particia Nearing, [place] 05/23/20 1650

## 2020-05-23 NOTE — ED Triage Notes (Addendum)
Patient in with complaints of left shoulder pain that started on yesterday after awaking from sleep. Patient states that pain radiates down to elbow. Patient denies chest pain,  SOB or other symptoms.Patient states that pain has increased and feels stiff. Patient states that he does have some heavy lifting at work.  Patient is able to lift arm without difficulty but does complain of pain.

## 2020-07-30 ENCOUNTER — Encounter (HOSPITAL_COMMUNITY): Payer: Self-pay

## 2020-07-30 ENCOUNTER — Ambulatory Visit (HOSPITAL_COMMUNITY)
Admission: EM | Admit: 2020-07-30 | Discharge: 2020-07-30 | Disposition: A | Discharge status: Home / Self Care | Payer: 59 | Attending: Internal Medicine | Admitting: Internal Medicine

## 2020-07-30 DIAGNOSIS — L729 Follicular cyst of the skin and subcutaneous tissue, unspecified: Secondary | ICD-10-CM

## 2020-07-30 NOTE — ED Provider Notes (Signed)
[facility]    CSN: [id number] Arrival date & time: 07/30/20  1911      History   Chief Complaint Chief Complaint  Patient presents with  . Nevus    HPI [name] is a 34 y.o. male with a history of subcutaneous cyst comes to urgent care to be evaluated.  Cyst has grown over the past 3 weeks.  No pain.  No erythema.  No discharge.  Patient has an appointment with the dermatologist in 2-1/2 to 3 weeks.Marland Kitchen   HPI  Past Medical History:  Diagnosis Date  . Appendicitis with perforation 06/07/2011  . Heart murmur   . Ulnar fracture 05/03/2017  . Urinary retention with incomplete bladder emptying 06/07/2011    Patient Active Problem List   Diagnosis Date Noted  . Cyst of perianal area 05/03/2017  . Tobacco abuse 05/03/2017    Past Surgical History:  Procedure Laterality Date  . APPENDECTOMY         Home Medications    Prior to Admission medications   Medication Sig Start Date End Date Taking? Authorizing Provider  albuterol (VENTOLIN HFA) 108 (90 Base) MCG/ACT inhaler Inhale 1-2 puffs into the lungs every 6 (six) hours as needed for wheezing or shortness of breath. 12/08/18   Elson Areas, PA-C  docusate sodium (COLACE) 100 MG capsule Take 2 tablets per day until stools are soft, then 1 per day until regular. 02/10/19   Roxy Horseman, PA-C  hyoscyamine (LEVSIN SL) 0.125 MG SL tablet Take 0.125 mg by mouth every 4 (four) hours as needed for cramping.  05/08/18   [provider]  ibuprofen (ADVIL) 600 MG tablet Take 1 tablet (600 mg total) by mouth every 6 (six) hours as needed. 02/07/20   Darr, Gerilyn Pilgrim, PA-C  meloxicam (MOBIC) 7.5 MG tablet Take 1 tablet (7.5 mg total) by mouth daily. 02/10/19   Cathie Hoops, Amy V, PA-C  methocarbamol (ROBAXIN) 500 MG tablet Take 1 tablet (500 mg total) by mouth 2 (two) times daily. 02/10/19   Cathie Hoops, Amy V, PA-C  pantoprazole (PROTONIX) 40 MG tablet Take 1 tablet (40 mg total) by mouth daily. 10/27/18   Derwood Kaplan, MD   dicyclomine (BENTYL) 20 MG tablet Take 1 tablet (20 mg total) by mouth 4 (four) times daily as needed for spasms (or abdominal cramping). Patient not taking: No sig reported 05/15/18 07/30/20  Dione Booze, MD    Family History Family History  Problem Relation Age of Onset  . Healthy Mother     Social History Social History   Tobacco Use  . Smoking status: Current Every Day Smoker    Packs/day: 1.00    Types: Cigarettes  . Smokeless tobacco: Never Used  Vaping Use  . Vaping Use: Never used  Substance Use Topics  . Alcohol use: Yes    Alcohol/week: 8.0 standard drinks    Types: 8 Cans of beer per week  . Drug use: Not Currently    Types: Marijuana    Comment: denies     Allergies   Patient has no known allergies.   Review of Systems Review of Systems  Skin: Negative for color change, pallor, rash and wound.     Physical Exam Triage Vital Signs ED Triage Vitals  Enc Vitals Group     BP 07/30/20 2026 (!) 125/91     Pulse Rate 07/30/20 2026 78     Resp 07/30/20 2026 18     Temp 07/30/20 2026 97.9 F (36.6 C)  Temp Source 07/30/20 2026 Oral     SpO2 07/30/20 2026 99 %     Weight --      Height --      Head Circumference --      Peak Flow --      Pain Score 07/30/20 2028 0     Pain Loc --      Pain Edu? --      Excl. in GC? --    No data found.  Updated Vital Signs BP (!) 125/91 (BP Location: Right Arm)   Pulse 78   Temp 97.9 F (36.6 C) (Oral)   Resp 18   SpO2 99%   Visual Acuity Right Eye Distance:   Left Eye Distance:   Bilateral Distance:    Right Eye Near:   Left Eye Near:    Bilateral Near:     Physical Exam Vitals and nursing note reviewed.  Skin:    General: Skin is warm.     Findings: Lesion present.     Comments: Subcutaneous cyst, firm to touch, has a punctum with no surrounding erythema.  Not attached to underlying structures.  Neurological:     Mental Status: He is alert.      UC Treatments / Results  Labs (all  labs ordered are listed, but only abnormal results are displayed) Labs Reviewed - No data to display  EKG   Radiology No results found.  Procedures Procedures (including critical care time)  Medications Ordered in UC Medications - No data to display  Initial Impression / Assessment and Plan / UC Course  I have reviewed the triage vital signs and the nursing notes.  Pertinent labs & imaging results that were available during my care of the patient were reviewed by me and considered in my medical decision making (see chart for details).    1.  Subcutaneous cyst: Patient is advised to follow-up with a dermatologist for further evaluation. Final Clinical Impressions(s) / UC Diagnoses   Final diagnoses:  Subcutaneous cyst     Discharge Instructions     Please keep your dermatologist appointment Avoid manipulating the cyst. Take Tylenol or Motrin if you have pain   ED Prescriptions    None     PDMP not reviewed this encounter.   Merrilee Jansky, MD 07/30/20 2052

## 2020-07-30 NOTE — ED Triage Notes (Signed)
Pt reports having a mole in the neck x 3 years Pt is concerned as he went to the PCP and the mole was flat 10 days ago, and now the mole looks like a "bump'.

## 2020-07-30 NOTE — Discharge Instructions (Signed)
Please keep your dermatologist appointment Avoid manipulating the cyst. Take Tylenol or Motrin if you have pain

## 2022-08-16 ENCOUNTER — Other Ambulatory Visit: Payer: Self-pay

## 2022-08-16 ENCOUNTER — Emergency Department (HOSPITAL_COMMUNITY)
Admission: EM | Admit: 2022-08-16 | Discharge: 2022-08-16 | Disposition: A | Discharge status: Home / Self Care | Payer: 59 | Attending: Emergency Medicine | Admitting: Emergency Medicine

## 2022-08-16 DIAGNOSIS — L739 Follicular disorder, unspecified: Secondary | ICD-10-CM | POA: Insufficient documentation

## 2022-08-16 MED ORDER — SULFAMETHOXAZOLE-TRIMETHOPRIM 800-160 MG PO TABS: 1.0000 | ORAL_TABLET | Freq: Two times a day (BID) | ORAL | 0 refills | Status: AC

## 2022-08-16 NOTE — Discharge Instructions (Addendum)
Apply warm compresses at least 3 times a day for 15-20 minutes each time. We recommend Bactrim to prevent infection as well as ibuprofen for pain. Follow-up with a primary care doctor for recheck, as needed. You may return to the emergency department for new or concerning symptoms.

## 2022-08-16 NOTE — ED Provider Notes (Signed)
[facility] Provider Note   CSN: [id number] Arrival date & time: 08/16/22  0145     History  Chief Complaint  Patient presents with   Gential Bump    [name] is a 37 y.o. male.  37 year old male presents to the emergency department for evaluation of a bump he noted to his genital area.  States that the area is nontender and is not aggravated by defecation.  He has been using an over-the-counter cream and believes it may have been helping some.  Does allude to some potential drainage from the area yesterday.  No associated fevers, melena, hematochezia, testicular tenderness or scrotal swelling.  The history is provided by the patient. No language interpreter was used.       Home Medications Prior to Admission medications   Medication Sig Start Date End Date Taking? Authorizing Provider  sulfamethoxazole-trimethoprim (BACTRIM DS) 800-160 MG tablet Take 1 tablet by mouth 2 (two) times daily for 7 days. 08/16/22 08/23/22 Yes Antonietta Breach, PA-C  albuterol (VENTOLIN HFA) 108 (90 Base) MCG/ACT inhaler Inhale 1-2 puffs into the lungs every 6 (six) hours as needed for wheezing or shortness of breath. 12/08/18   Fransico Meadow, PA-C  docusate sodium (COLACE) 100 MG capsule Take 2 tablets per day until stools are soft, then 1 per day until regular. 02/10/19   Montine Circle, PA-C  hyoscyamine (LEVSIN SL) 0.125 MG SL tablet Take 0.125 mg by mouth every 4 (four) hours as needed for cramping.  05/08/18   [provider]  ibuprofen (ADVIL) 600 MG tablet Take 1 tablet (600 mg total) by mouth every 6 (six) hours as needed. 02/07/20   Darr, Edison Nasuti, PA-C  meloxicam (MOBIC) 7.5 MG tablet Take 1 tablet (7.5 mg total) by mouth daily. 02/10/19   Tasia Catchings, Amy V, PA-C  methocarbamol (ROBAXIN) 500 MG tablet Take 1 tablet (500 mg total) by mouth 2 (two) times daily. 02/10/19   Tasia Catchings, Amy V, PA-C  pantoprazole (PROTONIX) 40 MG tablet Take 1 tablet (40 mg total) by mouth  daily. 10/27/18   Varney Biles, MD  dicyclomine (BENTYL) 20 MG tablet Take 1 tablet (20 mg total) by mouth 4 (four) times daily as needed for spasms (or abdominal cramping). Patient not taking: No sig reported 08/65/78 46/96/29  Delora Fuel, MD      Allergies    Patient has no known allergies.    Review of Systems   Review of Systems Ten systems reviewed and are negative for acute change, except as noted in the HPI.    Physical Exam Updated Vital Signs BP 124/83   Pulse 65   Temp 98.5 F (36.9 C)   Resp 19   Ht 5\' 9"  (1.753 m)   Wt 62.1 kg   SpO2 100%   BMI 20.23 kg/m   Physical Exam Vitals and nursing note reviewed.  Constitutional:      General: He is not in acute distress.    Appearance: He is well-developed. He is not diaphoretic.  HENT:     Head: Normocephalic and atraumatic.  Eyes:     General: No scleral icterus.    Conjunctiva/sclera: Conjunctivae normal.  Pulmonary:     Effort: Pulmonary effort is normal. No respiratory distress.  Genitourinary:      Comments: Exam chaperoned by Loree Fee, RN. No perianal changes, induration, TTP. No external hemorrhoids. Musculoskeletal:        General: Normal range of motion.     Cervical back: Normal  range of motion.  Skin:    General: Skin is warm and dry.     Coloration: Skin is not pale.     Findings: No erythema or rash.  Neurological:     Mental Status: He is alert and oriented to person, place, and time.  Psychiatric:        Behavior: Behavior normal.     ED Results / Procedures / Treatments   Labs (all labs ordered are listed, but only abnormal results are displayed) Labs Reviewed - No data to display  EKG None  Radiology No results found.  Procedures Procedures    Medications Ordered in ED Medications - No data to display  ED Course/ Medical Decision Making/ A&P                             Medical Decision Making Risk Prescription drug management.   This patient presents to the ED  for concern of genital bump, this involves an extensive number of treatment options, and is a complaint that carries with it a high risk of complications and morbidity.  The differential diagnosis includes folliculitis vs cellulitis vs abscess vs Fournier's    Co morbidities that complicate the patient evaluation  Heart murmur   Cardiac Monitoring:  The patient was maintained on a cardiac monitor.  I personally viewed and interpreted the cardiac monitored which showed an underlying rhythm of: NSR   Medicines ordered and prescription drug management:  I have reviewed the patients home medicines and have made adjustments as needed   Test Considered:  Hemoccult    Problem List / ED Course:  GU exam chaperoned by nurse.  There is no evidence of external hemorrhoids.  No perianal induration to suggest perianal abscess.  There is a very small indurated area to the perineum, at the base of the right hemiscrotum.  This feels most consistent with folliculitis without evidence of secondary infection, cellulitis, abscess.  Given sensitive nature of this area, the patient has been initiated on a course of Bactrim.   Reevaluation:  After the interventions noted above, I reevaluated the patient and found that they have :stayed the same   Social Determinants of Health:  Insured patient   Dispostion:  After consideration of the diagnostic results and the patients response to treatment, I feel that the patent would benefit from outpatient supportive care. Encouraged warm compresses. Patient to complete Bactrim course to prevent development of cellulitis and/or abscess. Return precautions discussed and provided. Patient discharged in stable condition with no unaddressed concerns.          Final Clinical Impression(s) / ED Diagnoses Final diagnoses:  Perineal folliculitis    Rx / DC Orders ED Discharge Orders          Ordered    sulfamethoxazole-trimethoprim (BACTRIM DS)  800-160 MG tablet  2 times daily        08/16/22 0224              Antonietta Breach, PA-C 08/16/22 7619    Quintella Reichert, MD 08/16/22 [phone]

## 2022-08-16 NOTE — ED Triage Notes (Signed)
Pt arrives with c/o hemorrhoids. Pt denies bleeding. Per pt, he has been using OTC cream and it has helped a little.

## 2023-06-19 ENCOUNTER — Encounter (HOSPITAL_BASED_OUTPATIENT_CLINIC_OR_DEPARTMENT_OTHER): Payer: Self-pay

## 2023-06-19 ENCOUNTER — Other Ambulatory Visit: Payer: Self-pay

## 2023-06-19 DIAGNOSIS — N451 Epididymitis: Secondary | ICD-10-CM | POA: Insufficient documentation

## 2023-06-19 DIAGNOSIS — J45909 Unspecified asthma, uncomplicated: Secondary | ICD-10-CM | POA: Diagnosis not present

## 2023-06-19 NOTE — ED Triage Notes (Signed)
Pt states he has noticed a lump near his testicles

## 2023-06-20 ENCOUNTER — Emergency Department (HOSPITAL_BASED_OUTPATIENT_CLINIC_OR_DEPARTMENT_OTHER)
Admission: EM | Admit: 2023-06-20 | Discharge: 2023-06-20 | Disposition: A | Discharge status: Home / Self Care | Payer: 59 | Attending: Emergency Medicine | Admitting: Emergency Medicine

## 2023-06-20 DIAGNOSIS — L0292 Furuncle, unspecified: Secondary | ICD-10-CM

## 2023-06-20 MED ORDER — DOXYCYCLINE HYCLATE 100 MG PO CAPS: 100.0000 mg | ORAL_CAPSULE | Freq: Two times a day (BID) | ORAL | 0 refills | Status: DC

## 2023-06-20 MED ORDER — DOXYCYCLINE HYCLATE 100 MG PO TABS
100.0000 mg | ORAL_TABLET | Freq: Once | ORAL | Status: AC
  Administered 2023-06-20: 100 mg via ORAL
  Filled 2023-06-20: qty 1

## 2023-06-20 MED ORDER — DOXYCYCLINE HYCLATE 100 MG PO CAPS: 100.0000 mg | ORAL_CAPSULE | Freq: Two times a day (BID) | ORAL | 0 refills | Status: AC

## 2023-06-20 NOTE — ED Provider Notes (Signed)
[place] EMERGENCY DEPARTMENT AT [facility] Provider Note   CSN: [id number] Arrival date & time: 06/19/23  2328     History  Chief Complaint  Patient presents with   lump near testicles    [name] is a 37 y.o. male.  Patient is a 37 year old male with history of asthma, GERD.  Patient presenting today with complaints of swelling to the soft tissues posterior to his testicles.  This has been worsening over the past several days.  No injury or trauma.  No fevers or chills.  No constipation.  The history is provided by the patient.       Home Medications Prior to Admission medications   Medication Sig Start Date End Date Taking? Authorizing Provider  albuterol (VENTOLIN HFA) 108 (90 Base) MCG/ACT inhaler Inhale 1-2 puffs into the lungs every 6 (six) hours as needed for wheezing or shortness of breath. 12/08/18   Elson Areas, PA-C  docusate sodium (COLACE) 100 MG capsule Take 2 tablets per day until stools are soft, then 1 per day until regular. 02/10/19   Roxy Horseman, PA-C  hyoscyamine (LEVSIN SL) 0.125 MG SL tablet Take 0.125 mg by mouth every 4 (four) hours as needed for cramping.  05/08/18   [provider]  ibuprofen (ADVIL) 600 MG tablet Take 1 tablet (600 mg total) by mouth every 6 (six) hours as needed. 02/07/20   Darr, Gerilyn Pilgrim, PA-C  meloxicam (MOBIC) 7.5 MG tablet Take 1 tablet (7.5 mg total) by mouth daily. 02/10/19   Cathie Hoops, Amy V, PA-C  methocarbamol (ROBAXIN) 500 MG tablet Take 1 tablet (500 mg total) by mouth 2 (two) times daily. 02/10/19   Cathie Hoops, Amy V, PA-C  pantoprazole (PROTONIX) 40 MG tablet Take 1 tablet (40 mg total) by mouth daily. 10/27/18   Derwood Kaplan, MD  dicyclomine (BENTYL) 20 MG tablet Take 1 tablet (20 mg total) by mouth 4 (four) times daily as needed for spasms (or abdominal cramping). Patient not taking: No sig reported 05/15/18 07/30/20  Dione Booze, MD      Allergies    Patient has no known allergies.    Review of Systems    Review of Systems  All other systems reviewed and are negative.   Physical Exam Updated Vital Signs BP 114/74   Pulse 65   Temp 98.9 F (37.2 C) (Temporal)   Resp 18   Ht 5\' 8"  (1.727 m)   Wt 59 kg   SpO2 99%   BMI 19.77 kg/m  Physical Exam Vitals and nursing note reviewed.  Constitutional:      Appearance: Normal appearance.  Pulmonary:     Effort: Pulmonary effort is normal.  Genitourinary:    Comments: To the soft tissues between his testicles and anus, there is a 1 cm, firm, nodule.  There is mild overlying erythema, but no fluctuance. Skin:    General: Skin is warm and dry.  Neurological:     Mental Status: He is alert.     ED Results / Procedures / Treatments   Labs (all labs ordered are listed, but only abnormal results are displayed) Labs Reviewed - No data to display  EKG None  Radiology No results found.  Procedures Procedures    Medications Ordered in ED Medications  doxycycline (VIBRA-TABS) tablet 100 mg (has no administration in time range)    ED Course/ Medical Decision Making/ A&P  Patient has a very small nodule noted to the perineum and the soft tissues between his testicles  and anus.  I am unable to palpate any fluctuance and feel as though this is too small for incision and drainage.  I will have patient use warm compresses/sitz bath's and prescribe doxycycline.  Patient is to return if this worsens.  This is separate from his anus.  Final Clinical Impression(s) / ED Diagnoses Final diagnoses:  None    Rx / DC Orders ED Discharge Orders     None         Geoffery Lyons, MD 06/20/23 0131

## 2023-06-20 NOTE — Discharge Instructions (Signed)
Begin taking doxycycline as prescribed.  Apply warm compresses as frequently as possible for the next several days.  Follow-up with your primary doctor if not improving, and return to the ER if symptoms worsen or change.

## 2023-10-08 ENCOUNTER — Encounter (HOSPITAL_COMMUNITY): Payer: Self-pay | Admitting: Emergency Medicine

## 2023-10-08 ENCOUNTER — Emergency Department (HOSPITAL_COMMUNITY)
Admission: EM | Admit: 2023-10-08 | Discharge: 2023-10-09 | Disposition: A | Discharge status: Home / Self Care | Attending: Emergency Medicine | Admitting: Emergency Medicine

## 2023-10-08 ENCOUNTER — Other Ambulatory Visit: Payer: Self-pay

## 2023-10-08 DIAGNOSIS — J111 Influenza due to unidentified influenza virus with other respiratory manifestations: Secondary | ICD-10-CM

## 2023-10-08 DIAGNOSIS — J101 Influenza due to other identified influenza virus with other respiratory manifestations: Secondary | ICD-10-CM | POA: Insufficient documentation

## 2023-10-08 DIAGNOSIS — R509 Fever, unspecified: Secondary | ICD-10-CM | POA: Diagnosis present

## 2023-10-08 MED ORDER — ACETAMINOPHEN 325 MG PO TABS
650.0000 mg | ORAL_TABLET | Freq: Once | ORAL | Status: AC
  Administered 2023-10-08: 650 mg via ORAL
  Filled 2023-10-08: qty 2

## 2023-10-08 NOTE — ED Triage Notes (Signed)
 Patient complaining of generalized body aches, fatigue, and decreased appetite x3 days. Reports sick contacts at home. Denies fevers.

## 2023-10-09 LAB — RESP PANEL BY RT-PCR (RSV, FLU A&B, COVID)  RVPGX2
Influenza A by PCR: POSITIVE — AB
Influenza B by PCR: NEGATIVE
Resp Syncytial Virus by PCR: NEGATIVE
SARS Coronavirus 2 by RT PCR: NEGATIVE

## 2023-10-09 MED ORDER — BENZONATATE 100 MG PO CAPS: 100.0000 mg | ORAL_CAPSULE | Freq: Three times a day (TID) | ORAL | 0 refills | Status: AC

## 2023-10-09 MED ORDER — ONDANSETRON 8 MG PO TBDP: 8.0000 mg | ORAL_TABLET | Freq: Three times a day (TID) | ORAL | 0 refills | Status: AC | PRN

## 2023-10-09 NOTE — ED Provider Notes (Signed)
 [place] EMERGENCY DEPARTMENT AT [facility] Provider Note   CSN: [id number] Arrival date & time: 10/08/23  2139     History  Chief Complaint  Patient presents with   Generalized Body Aches    [name] is a 38 y.o. male.  HPI   Patient has a history of appendicitis and a heart murmur.  He presents to the ED for evaluation of fevers chills body aches poor appetite cough.  There have been family members at home that have been sick.  He has not been able to eat a full meal since Wednesday.  That was the last day that he worked.  He has not had much appetite.  He continues to have bodyaches.  He has had fevers.  He has taken over-the-counter medication such as Tylenol and ibuprofen.  Patient has not had vomiting or diarrhea.  No shortness of breath.  No chest pain.  Home Medications Prior to Admission medications   Medication Sig Start Date End Date Taking? Authorizing Provider  benzonatate (TESSALON) 100 MG capsule Take 1 capsule (100 mg total) by mouth every 8 (eight) hours. 10/09/23  Yes Linwood Dibbles, MD  ondansetron (ZOFRAN-ODT) 8 MG disintegrating tablet Take 1 tablet (8 mg total) by mouth every 8 (eight) hours as needed for nausea or vomiting. 10/09/23  Yes Linwood Dibbles, MD  albuterol (VENTOLIN HFA) 108 (90 Base) MCG/ACT inhaler Inhale 1-2 puffs into the lungs every 6 (six) hours as needed for wheezing or shortness of breath. 12/08/18   Elson Areas, PA-C  docusate sodium (COLACE) 100 MG capsule Take 2 tablets per day until stools are soft, then 1 per day until regular. 02/10/19   Roxy Horseman, PA-C  doxycycline (VIBRAMYCIN) 100 MG capsule Take 1 capsule (100 mg total) by mouth 2 (two) times daily. One po bid x 7 days 06/20/23   Geoffery Lyons, MD  hyoscyamine (LEVSIN SL) 0.125 MG SL tablet Take 0.125 mg by mouth every 4 (four) hours as needed for cramping.  05/08/18   [provider]  ibuprofen (ADVIL) 600 MG tablet Take 1 tablet (600 mg total) by mouth every 6  (six) hours as needed. 02/07/20   Darr, Gerilyn Pilgrim, PA-C  meloxicam (MOBIC) 7.5 MG tablet Take 1 tablet (7.5 mg total) by mouth daily. 02/10/19   Cathie Hoops, Amy V, PA-C  methocarbamol (ROBAXIN) 500 MG tablet Take 1 tablet (500 mg total) by mouth 2 (two) times daily. 02/10/19   Cathie Hoops, Amy V, PA-C  pantoprazole (PROTONIX) 40 MG tablet Take 1 tablet (40 mg total) by mouth daily. 10/27/18   Derwood Kaplan, MD  dicyclomine (BENTYL) 20 MG tablet Take 1 tablet (20 mg total) by mouth 4 (four) times daily as needed for spasms (or abdominal cramping). Patient not taking: No sig reported 05/15/18 07/30/20  Dione Booze, MD      Allergies    Patient has no known allergies.    Review of Systems   Review of Systems  Physical Exam Updated Vital Signs BP 101/66 (BP Location: Left Arm)   Pulse 68   Temp 99.2 F (37.3 C)   Resp 16   Ht 1.727 m (5\' 8" )   Wt 56.7 kg   SpO2 96%   BMI 19.01 kg/m  Physical Exam Vitals and nursing note reviewed.  Constitutional:      General: He is not in acute distress.    Appearance: He is well-developed.  HENT:     Head: Normocephalic and atraumatic.  Right Ear: External ear normal.     Left Ear: External ear normal.  Eyes:     General: No scleral icterus.       Right eye: No discharge.        Left eye: No discharge.     Conjunctiva/sclera: Conjunctivae normal.  Neck:     Trachea: No tracheal deviation.  Cardiovascular:     Rate and Rhythm: Normal rate and regular rhythm.  Pulmonary:     Effort: Pulmonary effort is normal. No respiratory distress.     Breath sounds: Normal breath sounds. No stridor. No wheezing.  Abdominal:     General: There is no distension.     Tenderness: There is no abdominal tenderness. There is no guarding.  Musculoskeletal:        General: No swelling or deformity.     Cervical back: Neck supple.  Skin:    General: Skin is warm and dry.     Findings: No rash.  Neurological:     Mental Status: He is alert. Mental status is at baseline.      Cranial Nerves: No dysarthria or facial asymmetry.     Motor: No seizure activity.     ED Results / Procedures / Treatments   Labs (all labs ordered are listed, but only abnormal results are displayed) Labs Reviewed  RESP PANEL BY RT-PCR (RSV, FLU A&B, COVID)  RVPGX2 - Abnormal; Notable for the following components:      Result Value   Influenza A by PCR POSITIVE (*)    All other components within normal limits    EKG EKG Interpretation Date/Time:  Saturday October 08 2023 22:27:57 EDT Ventricular Rate:  74 PR Interval:  168 QRS Duration:  86 QT Interval:  354 QTC Calculation: 392 R Axis:   100  Text Interpretation: Normal sinus rhythm Rightward axis Nonspecific T wave abnormality Abnormal ECG When compared with ECG of 09-Feb-2019 22:44, nonspecific t wave changes are new Confirmed by Linwood Dibbles [phone]) on 10/09/2023 7:23:11 AM  Radiology No results found.  Procedures Procedures    Medications Ordered in ED Medications  acetaminophen (TYLENOL) tablet 650 mg (650 mg Oral Given 10/08/23 2258)    ED Course/ Medical Decision Making/ A&P                                 Medical Decision Making Risk OTC drugs.   Patient noted to have a fever on arrival.  Repeat temperature showed resolution of the fever.  Patient's exam is reassuring.  His influenza test is positive.  He does not appear dehydrated.  Patient is not in any distress.  Lungs are clear without suggestion of pneumonia.  Symptoms ongoing since Wednesday.  Tamiflu not likely to be beneficial at this time.  Will discharge home with prescription for Zofran Tessalon for cough.  Recommend will provide a work note so patient continue to rest and recover.        Final Clinical Impression(s) / ED Diagnoses Final diagnoses:  Influenza    Rx / DC Orders ED Discharge Orders          Ordered    benzonatate (TESSALON) 100 MG capsule  Every 8 hours        10/09/23 0726    ondansetron (ZOFRAN-ODT) 8 MG disintegrating  tablet  Every 8 hours PRN        10/09/23 0726  Linwood Dibbles, MD 10/09/23 [phone]

## 2023-10-09 NOTE — Discharge Instructions (Signed)
 Your symptoms should improve over the next few days.  Rest drink plenty of fluids.  Follow-up with your doctor to be rechecked if the symptoms do not resolve in the next couple of days
